# Patient Record
Sex: Female | Born: 1980
Health system: Southern US, Community
[De-identification: ages and names within clinical notes are randomized; demographics above are authoritative.]

## PROBLEM LIST (undated history)

## (undated) HISTORY — PX: COSMETIC SURGERY: SHX468

---

## 2003-06-10 ENCOUNTER — Emergency Department (HOSPITAL_COMMUNITY): Admission: EM | Admit: 2003-06-10 | Discharge: 2003-06-10 | Payer: Self-pay | Admitting: Emergency Medicine

## 2007-02-26 ENCOUNTER — Emergency Department (HOSPITAL_COMMUNITY): Admission: EM | Admit: 2007-02-26 | Discharge: 2007-02-26 | Payer: Self-pay | Admitting: Emergency Medicine

## 2007-11-14 ENCOUNTER — Emergency Department (HOSPITAL_COMMUNITY): Admission: EM | Admit: 2007-11-14 | Discharge: 2007-11-14 | Payer: Self-pay | Admitting: Family Medicine

## 2013-08-26 ENCOUNTER — Encounter (HOSPITAL_COMMUNITY): Payer: Self-pay | Admitting: Emergency Medicine

## 2013-08-26 ENCOUNTER — Emergency Department (HOSPITAL_COMMUNITY)
Admission: EM | Admit: 2013-08-26 | Discharge: 2013-08-26 | Disposition: A | Discharge status: Home / Self Care | Payer: 59 | Source: Home / Self Care | Attending: Emergency Medicine | Admitting: Emergency Medicine

## 2013-08-26 DIAGNOSIS — J019 Acute sinusitis, unspecified: Secondary | ICD-10-CM

## 2013-08-26 DIAGNOSIS — J310 Chronic rhinitis: Secondary | ICD-10-CM

## 2013-08-26 MED ORDER — FEXOFENADINE-PSEUDOEPHED ER 60-120 MG PO TB12: 1.0000 | ORAL_TABLET | Freq: Two times a day (BID) | ORAL | Status: DC

## 2013-08-26 MED ORDER — AMOXICILLIN-POT CLAVULANATE 875-125 MG PO TABS: 1.0000 | ORAL_TABLET | Freq: Two times a day (BID) | ORAL | Status: DC

## 2013-08-26 MED ORDER — FLUTICASONE PROPIONATE 50 MCG/ACT NA SUSP: 2.0000 | Freq: Every day | NASAL | Status: DC

## 2013-08-26 MED ORDER — FLUCONAZOLE 150 MG PO TABS: 150.0000 mg | ORAL_TABLET | Freq: Once | ORAL | Status: DC

## 2013-08-26 NOTE — ED Provider Notes (Signed)
Chief Complaint:   Chief Complaint  Patient presents with  . 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 For the past month he's had nasal congestion with yellow-green drainage, sinus pressure, ear pressure, sneezing, itchy, watery eyes, and a dry cough. She denies fever, chills, headache, sore throat, or GI symptoms. She has no definite history of allergies or hayfever, and is not exposed to any obvious allergens or antigens.  Review of Systems:  Other than noted above, the patient denies any of the following symptoms: Systemic:  No fevers, chills, sweats, weight loss or gain, fatigue, or tiredness. Eye:  No redness or discharge. ENT:  No ear pain, drainage, headache, nasal congestion, drainage, sinus pressure, difficulty swallowing, or sore throat. Neck:  No neck pain or swollen glands. Lungs:  No cough, sputum production, hemoptysis, wheezing, chest tightness, shortness of breath or chest pain. GI:  No abdominal pain, nausea, vomiting or diarrhea.  PMFSH:  Past medical history, family history, social history, meds, and allergies were reviewed. She is allergic to tetracycline. She takes occasional Valtrex for fever blisters on her lips.  Physical Exam:   Vital signs:  BP 110/73  Pulse 86  Temp(Src) 97.2 F (36.2 C) (Oral)  Resp 20  SpO2 98%  LMP 08/03/2013 General:  Alert and oriented.  In no distress.  Skin warm and dry. Eye:  No conjunctival injection or drainage. Lids were normal. ENT:  TMs and canals were normal, without erythema or inflammation.  Nasal mucosa was congested bilaterally with clear drainage.  Mucous membranes were moist.  Pharynx was clear with no exudate or drainage.  There were no oral ulcerations or lesions. Neck:  Supple, no adenopathy, tenderness or mass. Lungs:  No respiratory distress.  Lungs were clear to auscultation, without wheezes, rales or  rhonchi.  Breath sounds were clear and equal bilaterally.  Heart:  Regular rhythm, without gallops, murmers or rubs. Skin:  Clear, warm, and dry, without rash or lesions.  Assessment:  The primary encounter diagnosis was Acute sinusitis. A diagnosis of Rhinitis was also pertinent to this visit.  She appears to have rhinitis, this may because by allergic rhinitis or vasomotor rhinitis. Additionally she is using nasal sprays which may be causing rhinitis medicamentosa. There may be some element of infection as well. To use medications for the next 10-20 days. If no improvement consider consulting an allergist.  Plan:   1.  Meds:  The following meds were prescribed:   Discharge Medication List as of 08/26/2013 12:06 PM    START taking these medications   Details  amoxicillin-clavulanate (AUGMENTIN) 875-125 MG per tablet Take 1 tablet by mouth 2 (two) times daily., Starting 08/26/2013, Until Discontinued, Normal    fexofenadine-pseudoephedrine (ALLEGRA-D) 60-120 MG per tablet Take 1 tablet by mouth every 12 (twelve) hours., Starting 08/26/2013, Until Discontinued, Normal    fluconazole (DIFLUCAN) 150 MG tablet Take 1 tablet (150 mg total) by mouth once., Starting 08/26/2013, Normal    fluticasone (FLONASE) 50 MCG/ACT nasal spray Place 2 sprays into both nostrils daily., Starting 08/26/2013, Until Discontinued, Normal        2.  Patient Education/Counseling:  The patient was given appropriate handouts, self care instructions, and instructed in symptomatic relief.  Given some instructions about allergen avoidance.  3.  Follow up:  The patient was told to follow up if no better in 3 to 4 days,  if becoming worse in any way, and given some red flag symptoms such as fever or worsening pain which would prompt immediate return.  Follow up with Dr. Patrice Paradise if no better in 20 days.      Reuben Likes, MD 08/26/13 1346

## 2013-08-26 NOTE — ED Notes (Signed)
Reports a month long history of nasal congestion, facial pain and pressure, unsure about fever.  Reports decongestant nose spray helped the most, but has limited use of this drug as instructed.

## 2014-11-22 ENCOUNTER — Emergency Department
Admission: EM | Admit: 2014-11-22 | Discharge: 2014-11-22 | Disposition: A | Discharge status: Home / Self Care | Payer: 59 | Source: Home / Self Care | Attending: Family Medicine | Admitting: Family Medicine

## 2014-11-22 ENCOUNTER — Encounter: Payer: Self-pay | Admitting: Emergency Medicine

## 2014-11-22 DIAGNOSIS — J329 Chronic sinusitis, unspecified: Secondary | ICD-10-CM

## 2014-11-22 DIAGNOSIS — B349 Viral infection, unspecified: Secondary | ICD-10-CM

## 2014-11-22 DIAGNOSIS — B9789 Other viral agents as the cause of diseases classified elsewhere: Secondary | ICD-10-CM

## 2014-11-22 DIAGNOSIS — L259 Unspecified contact dermatitis, unspecified cause: Secondary | ICD-10-CM

## 2014-11-22 MED ORDER — TRIAMCINOLONE ACETONIDE 0.1 % EX CREA: 1.0000 "application " | TOPICAL_CREAM | Freq: Two times a day (BID) | CUTANEOUS | Status: DC

## 2014-11-22 MED ORDER — IPRATROPIUM BROMIDE 0.06 % NA SOLN: 2.0000 | Freq: Four times a day (QID) | NASAL | Status: DC

## 2014-11-22 MED ORDER — PREDNISONE 5 MG PO KIT: PACK | ORAL | Status: DC

## 2014-11-22 NOTE — Discharge Instructions (Signed)
Thank you for coming in today. Take the prednisone as directed. Use the Atrovent nasal spray. Use triamcinolone cream as needed. Use over-the-counter Gold Bond Itch as needed. Follow-up with primary care provider.  Contact Dermatitis Contact dermatitis is a reaction to certain substances that touch the skin. Contact dermatitis can be either irritant contact dermatitis or allergic contact dermatitis. Irritant contact dermatitis does not require previous exposure to the substance for a reaction to occur.Allergic contact dermatitis only occurs if you have been exposed to the substance before. Upon a repeat exposure, your body reacts to the substance.  CAUSES  Many substances can cause contact dermatitis. Irritant dermatitis is most commonly caused by repeated exposure to mildly irritating substances, such as:  Makeup.  Soaps.  Detergents.  Bleaches.  Acids.  Metal salts, such as nickel. Allergic contact dermatitis is most commonly caused by exposure to:  Poisonous plants.  Chemicals (deodorants, shampoos).  Jewelry.  Latex.  Neomycin in triple antibiotic cream.  Preservatives in products, including clothing. SYMPTOMS  The area of skin that is exposed may develop:  Dryness or flaking.  Redness.  Cracks.  Itching.  Pain or a burning sensation.  Blisters. With allergic contact dermatitis, there may also be swelling in areas such as the eyelids, mouth, or genitals.  DIAGNOSIS  Your caregiver can usually tell what the problem is by doing a physical exam. In cases where the cause is uncertain and an allergic contact dermatitis is suspected, a patch skin test may be performed to help determine the cause of your dermatitis. TREATMENT Treatment includes protecting the skin from further contact with the irritating substance by avoiding that substance if possible. Barrier creams, powders, and gloves may be helpful. Your caregiver may also recommend:  Steroid creams or  ointments applied 2 times daily. For best results, soak the rash area in cool water for 20 minutes. Then apply the medicine. Cover the area with a plastic wrap. You can store the steroid cream in the refrigerator for a "chilly" effect on your rash. That may decrease itching. Oral steroid medicines may be needed in more severe cases.  Antibiotics or antibacterial ointments if a skin infection is present.  Antihistamine lotion or an antihistamine taken by mouth to ease itching.  Lubricants to keep moisture in your skin.  Burow's solution to reduce redness and soreness or to dry a weeping rash. Mix one packet or tablet of solution in 2 cups cool water. Dip a clean washcloth in the mixture, wring it out a bit, and put it on the affected area. Leave the cloth in place for 30 minutes. Do this as often as possible throughout the day.  Taking several cornstarch or baking soda baths daily if the area is too large to cover with a washcloth. Harsh chemicals, such as alkalis or acids, can cause skin damage that is like a burn. You should flush your skin for 15 to 20 minutes with cold water after such an exposure. You should also seek immediate medical care after exposure. Bandages (dressings), antibiotics, and pain medicine may be needed for severely irritated skin.  HOME CARE INSTRUCTIONS  Avoid the substance that caused your reaction.  Keep the area of skin that is affected away from hot water, soap, sunlight, chemicals, acidic substances, or anything else that would irritate your skin.  Do not scratch the rash. Scratching may cause the rash to become infected.  You may take cool baths to help stop the itching.  Only take over-the-counter or prescription medicines as  directed by your caregiver.  See your caregiver for follow-up care as directed to make sure your skin is healing properly. SEEK MEDICAL CARE IF:   Your condition is not better after 3 days of treatment.  You seem to be getting  worse.  You see signs of infection such as swelling, tenderness, redness, soreness, or warmth in the affected area.  You have any problems related to your medicines. Document Released: 09/16/2000 Document Revised: 12/12/2011 Document Reviewed: 02/22/2011 [facility] Patient Information 2015 [place], [place]. This information is not intended to replace advice given to you by your health care provider. Make sure you discuss any questions you have with your health care provider.   Sinusitis Sinusitis is redness, soreness, and inflammation of the paranasal sinuses. Paranasal sinuses are air pockets within the bones of your face (beneath the eyes, the middle of the forehead, or above the eyes). In healthy paranasal sinuses, mucus is able to drain out, and air is able to circulate through them by way of your nose. However, when your paranasal sinuses are inflamed, mucus and air can become trapped. This can allow bacteria and other germs to grow and cause infection. Sinusitis can develop quickly and last only a short time (acute) or continue over a long period (chronic). Sinusitis that lasts for more than 12 weeks is considered chronic.  CAUSES  Causes of sinusitis include:  Allergies.  Structural abnormalities, such as displacement of the cartilage that separates your nostrils (deviated septum), which can decrease the air flow through your nose and sinuses and affect sinus drainage.  Functional abnormalities, such as when the small hairs (cilia) that line your sinuses and help remove mucus do not work properly or are not present. SIGNS AND SYMPTOMS  Symptoms of acute and chronic sinusitis are the same. The primary symptoms are pain and pressure around the affected sinuses. Other symptoms include:  Upper toothache.  Earache.  Headache.  Bad breath.  Decreased sense of smell and taste.  A cough, which worsens when you are lying flat.  Fatigue.  Fever.  Thick drainage from your nose, which  often is green and may contain pus (purulent).  Swelling and warmth over the affected sinuses. DIAGNOSIS  Your health care provider will perform a physical exam. During the exam, your health care provider may:  Look in your nose for signs of abnormal growths in your nostrils (nasal polyps).  Tap over the affected sinus to check for signs of infection.  View the inside of your sinuses (endoscopy) using an imaging device that has a light attached (endoscope). If your health care provider suspects that you have chronic sinusitis, one or more of the following tests may be recommended:  Allergy tests.  Nasal culture. A sample of mucus is taken from your nose, sent to a lab, and screened for bacteria.  Nasal cytology. A sample of mucus is taken from your nose and examined by your health care provider to determine if your sinusitis is related to an allergy. TREATMENT  Most cases of acute sinusitis are related to a viral infection and will resolve on their own within 10 days. Sometimes medicines are prescribed to help relieve symptoms (pain medicine, decongestants, nasal steroid sprays, or saline sprays).  However, for sinusitis related to a bacterial infection, your health care provider will prescribe antibiotic medicines. These are medicines that will help kill the bacteria causing the infection.  Rarely, sinusitis is caused by a fungal infection. In theses cases, your health care provider will prescribe antifungal  medicine. For some cases of chronic sinusitis, surgery is needed. Generally, these are cases in which sinusitis recurs more than 3 times per year, despite other treatments. HOME CARE INSTRUCTIONS   Drink plenty of water. Water helps thin the mucus so your sinuses can drain more easily.  Use a humidifier.  Inhale steam 3 to 4 times a day (for example, sit in the bathroom with the shower running).  Apply a warm, moist washcloth to your face 3 to 4 times a day, or as directed by your  health care provider.  Use saline nasal sprays to help moisten and clean your sinuses.  Take medicines only as directed by your health care provider.  If you were prescribed either an antibiotic or antifungal medicine, finish it all even if you start to feel better. SEEK IMMEDIATE MEDICAL CARE IF:  You have increasing pain or severe headaches.  You have nausea, vomiting, or drowsiness.  You have swelling around your face.  You have vision problems.  You have a stiff neck.  You have difficulty breathing. MAKE SURE YOU:   Understand these instructions.  Will watch your condition.  Will get help right away if you are not doing well or get worse. Document Released: 09/19/2005 Document Revised: 02/03/2014 Document Reviewed: 10/04/2011 [facility] Patient Information 2015 [place], [place]. This information is not intended to replace advice given to you by your health care provider. Make sure you discuss any questions you have with your health care provider.

## 2014-11-22 NOTE — ED Provider Notes (Signed)
Allison Logan is a 34 y.o. female who presents to Urgent Care today for sinus congestion and pressure and discharge present for 5 days. No fevers chills nausea vomiting or diarrhea. Patient has tried Claritin which helps. Symptoms are bilateral.  Additionally patient notes a rash on her left buttocks. This is been present for 3 days. The rash is itchy. No soap detergent shampoos cosmetics etc. patient has tried hydrocortisone and Benadryl which did not help. No new medications or oral lesions.   History reviewed. No pertinent past medical history. Past Surgical History  Procedure Laterality Date  . Cosmetic surgery     History  Substance Use Topics  . Smoking status: Current Every Day Smoker  . Smokeless tobacco: Not on file  . Alcohol Use: Yes   ROS as above Medications: No current facility-administered medications for this encounter.   Current Outpatient Prescriptions  Medication Sig Dispense Refill  . Ascorbic Acid (VITAMIN C PO) Take by mouth.    . fexofenadine-pseudoephedrine (ALLEGRA-D) 60-120 MG per tablet Take 1 tablet by mouth every 12 (twelve) hours. 30 tablet 2  . fluticasone (FLONASE) 50 MCG/ACT nasal spray Place 2 sprays into both nostrils daily. 16 g 2  . ipratropium (ATROVENT) 0.06 % nasal spray Place 2 sprays into both nostrils 4 (four) times daily. 15 mL 1  . Multiple Vitamin (MULTIVITAMIN) tablet Take 1 tablet by mouth daily.    Marland Kitchen OVER THE COUNTER MEDICATION Decongestant nasal spray    . PredniSONE 5 MG KIT 12 days dosepack 1 kit 0  . triamcinolone cream (KENALOG) 0.1 % Apply 1 application topically 2 (two) times daily. 60 g 1  . VALACYCLOVIR HCL PO Take by mouth.     Allergies  Allergen Reactions  . Tetracyclines & Related      Exam:  BP 128/80 mmHg  Pulse 80  Temp(Src) 97 F (36.1 C) (Oral)  Wt 113 lb (51.256 kg)  SpO2 100%  LMP 11/16/2014 Gen: Well NAD HEENT: EOMI,  MMM clear nasal discharge present. Mildly inflamed nasal turbinates are present  bilaterally. Normal tympanic membranes bilaterally. Posterior pharynx is normal appearing Maxillary sinuses are nontender bilaterally. No oral lesions Lungs: Normal work of breathing. CTABL Heart: RRR no MRG Abd: NABS, Soft. Nondistended, Nontender Exts: Brisk capillary refill, warm and well perfused.  Skin: Mildly erythematous rash left buttocks nontender blanchable. Rash is macular  No results found for this or any previous visit (from the past 24 hour(s)). No results found.  Assessment and Plan: 34 y.o. female with  1) sinusitis. Treatment with prednisone and Atrovent nasal spray. 2) rash: Likely contact dermatitis. Treatment with triamcinolone cream  Discussed warning signs or symptoms. Please see discharge instructions. Patient expresses understanding.     Gregor Hams, MD 11/22/14 [phone]

## 2014-11-22 NOTE — ED Notes (Signed)
Pt c/o rash on her left buttocks she noticed it 3 days ago. States it it itching but denies pain or drainage. Also c/o nasal congestion and green mucous.

## 2014-11-24 ENCOUNTER — Encounter (HOSPITAL_BASED_OUTPATIENT_CLINIC_OR_DEPARTMENT_OTHER): Payer: Self-pay | Admitting: *Deleted

## 2014-11-24 ENCOUNTER — Telehealth: Payer: Self-pay | Admitting: *Deleted

## 2014-11-24 ENCOUNTER — Emergency Department (HOSPITAL_BASED_OUTPATIENT_CLINIC_OR_DEPARTMENT_OTHER)
Admission: EM | Admit: 2014-11-24 | Discharge: 2014-11-24 | Disposition: A | Discharge status: Home / Self Care | Payer: 59 | Attending: Emergency Medicine | Admitting: Emergency Medicine

## 2014-11-24 DIAGNOSIS — Z7952 Long term (current) use of systemic steroids: Secondary | ICD-10-CM | POA: Insufficient documentation

## 2014-11-24 DIAGNOSIS — Z720 Tobacco use: Secondary | ICD-10-CM | POA: Insufficient documentation

## 2014-11-24 DIAGNOSIS — R002 Palpitations: Secondary | ICD-10-CM | POA: Diagnosis not present

## 2014-11-24 DIAGNOSIS — Z7951 Long term (current) use of inhaled steroids: Secondary | ICD-10-CM | POA: Insufficient documentation

## 2014-11-24 DIAGNOSIS — Z79899 Other long term (current) drug therapy: Secondary | ICD-10-CM | POA: Diagnosis not present

## 2014-11-24 DIAGNOSIS — R079 Chest pain, unspecified: Secondary | ICD-10-CM | POA: Diagnosis present

## 2014-11-24 LAB — CBC
HEMATOCRIT: 36.8 % (ref 36.0–46.0)
Hemoglobin: 12.8 g/dL (ref 12.0–15.0)
MCH: 32.9 pg (ref 26.0–34.0)
MCHC: 34.8 g/dL (ref 30.0–36.0)
MCV: 94.6 fL (ref 78.0–100.0)
PLATELETS: 325 10*3/uL (ref 150–400)
RBC: 3.89 MIL/uL (ref 3.87–5.11)
RDW: 11.5 % (ref 11.5–15.5)
WBC: 5.2 10*3/uL (ref 4.0–10.5)

## 2014-11-24 LAB — BASIC METABOLIC PANEL
Anion gap: 3 — ABNORMAL LOW (ref 5–15)
BUN: 13 mg/dL (ref 6–23)
CHLORIDE: 102 mmol/L (ref 96–112)
CO2: 30 mmol/L (ref 19–32)
CREATININE: 0.66 mg/dL (ref 0.50–1.10)
Calcium: 9 mg/dL (ref 8.4–10.5)
GFR calc non Af Amer: >90 mL/min (ref >90)
Glucose, Bld: 119 mg/dL — ABNORMAL HIGH (ref 70–99)
Potassium: 4.3 mmol/L (ref 3.5–5.1)
SODIUM: 135 mmol/L (ref 135–145)

## 2014-11-24 LAB — TROPONIN I

## 2014-11-24 NOTE — ED Notes (Signed)
Pt d/c home- no new rx given

## 2014-11-24 NOTE — ED Provider Notes (Signed)
CSN: [id number]     Arrival date & time 11/24/14  1626 History   This chart was scribed for  , MD by Nadim Abu Hashem, ED Scribe. The patient was seen in MH11/[facility]11 and the patient's care was started at 5:07 PM.  Chief Complaint  Patient presents with  . Chest Pain   Patient is a 33 y.o. female presenting with chest pain. The history is provided by the patient. No language interpreter was used.  Chest Pain Associated symptoms: no abdominal pain, no back pain, no cough, no diaphoresis, no dizziness, no fatigue, no fever, no headache, no nausea, no numbness, no shortness of breath, not vomiting and no weakness     HPI Comments: Allison Logan is a 33 y.o. female who presents to the Emergency Department complaining of first time heart palpations which first began this morning. Checked her HR this morning and it was in the 150s. Then checked it while at work around 3:00 PM and her HR was 120s. She has intermittent chest pressure usually when her heart is beating fast, was lightheaded but no longer. Seen at urgent care for a HA and rash and given prednisone and steroid cream a few days ago.  She has only taken one dose of prednisone and she takes clartin D 12 hour but she takes this everyday. No FHx of SVT or heart murmurs. No long trips or recent surgeries. She denies SOB, pain with breathing, no leg swelling, abdominal pain, vomiting and diarrhea. Pt works at the [employer].  History reviewed. No pertinent past medical history. Past Surgical History  Procedure Laterality Date  . Cosmetic surgery     No family history on file. History  Substance Use Topics  . Smoking status: Current Some Day Smoker  . Smokeless tobacco: Not on file  . Alcohol Use: Yes     Comment: 1-2 glasses wine/day   OB History    No data available     Review of Systems  Constitutional: Negative for fever, chills, diaphoresis and fatigue.  HENT: Negative for congestion, rhinorrhea and sneezing.    Eyes: Negative.   Respiratory: Positive for chest tightness. Negative for cough and shortness of breath.   Cardiovascular: Negative for chest pain and leg swelling.  Gastrointestinal: Negative for nausea, vomiting, abdominal pain, diarrhea and blood in stool.  Genitourinary: Negative for frequency, hematuria, flank pain and difficulty urinating.  Musculoskeletal: Negative for back pain and arthralgias.  Skin: Negative for rash.  Neurological: Negative for dizziness, speech difficulty, weakness, numbness and headaches.      Allergies  Tetracyclines & related  Home Medications   Prior to Admission medications   Medication Sig Start Date End Date Taking? Authorizing Provider  Ascorbic Acid (VITAMIN C PO) Take by mouth.   Yes Historical Provider, MD  fluticasone (FLONASE) 50 MCG/ACT nasal spray Place 2 sprays into both nostrils daily. 08/26/13  Yes David C Keller, MD  Multiple Vitamin (MULTIVITAMIN) tablet Take 1 tablet by mouth daily.   Yes Historical Provider, MD  VALACYCLOVIR HCL PO Take by mouth as needed.    Yes Historical Provider, MD  fexofenadine-pseudoephedrine (ALLEGRA-D) 60-120 MG per tablet Take 1 tablet by mouth every 12 (twelve) hours. 08/26/13   David C Keller, MD  ipratropium (ATROVENT) 0.06 % nasal spray Place 2 sprays into both nostrils 4 (four) times daily. 11/22/14   Evan S Corey, MD  OVER THE COUNTER MEDICATION Decongestant nasal spray    Historical Provider, MD  PredniSONE 5 MG KIT   12 days dosepack 11/22/14   Evan S Corey, MD  triamcinolone cream (KENALOG) 0.1 % Apply 1 application topically 2 (two) times daily. 11/22/14   Evan S Corey, MD   BP 120/76 mmHg  Pulse 81  Temp(Src) 97.9 F (36.6 C) (Oral)  Resp 25  Ht 4' 11" (1.499 m)  Wt 110 lb (49.896 kg)  BMI 22.21 kg/m2  SpO2 100%  LMP 11/16/2014 Physical Exam  Constitutional: She is oriented to person, place, and time. She appears well-developed and well-nourished.  HENT:  Head: Normocephalic and atraumatic.   Eyes: Pupils are equal, round, and reactive to light.  Neck: Normal range of motion. Neck supple.  Cardiovascular: Normal rate, regular rhythm and normal heart sounds.   Pulmonary/Chest: Effort normal and breath sounds normal. No respiratory distress. She has no wheezes. She has no rales. She exhibits no tenderness.  Abdominal: Soft. Bowel sounds are normal. There is no tenderness. There is no rebound and no guarding.  Musculoskeletal: Normal range of motion. She exhibits no edema.  No calf tenderness  Lymphadenopathy:    She has no cervical adenopathy.  Neurological: She is alert and oriented to person, place, and time.  Skin: Skin is warm and dry. No rash noted.  Psychiatric: She has a normal mood and affect.    ED Course  Procedures  DIAGNOSTIC STUDIES: Oxygen Saturation is 100% on room air, normal by my interpretation.    COORDINATION OF CARE: 5:16 PM Discussed treatment plan with pt at bedside and pt agreed to plan.  Labs Review Labs Reviewed  BASIC METABOLIC PANEL - Abnormal; Notable for the following:    Glucose, Bld 119 (*)    Anion gap 3 (*)    All other components within normal limits  CBC  TROPONIN I    Imaging Review No results found.   EKG Interpretation   Date/Time:  Monday November 24 2014 16:33:31 EST Ventricular Rate:  89 PR Interval:  118 QRS Duration: 94 QT Interval:  372 QTC Calculation: 452 R Axis:   65 Text Interpretation:  Normal sinus rhythm Incomplete right bundle branch  block Borderline ECG No old tracing to compare Confirmed by   MD,   ([id number]) on 11/24/2014 4:53:23 PM      MDM   Final diagnoses:  Palpitations   Pt has had no symptoms in the ED.  Has remained in sinus rhythm.  Labs okay.  Will refer to cardiology for outpt f/u and possible Holter monitoring.  Return precautions given.  I personally performed the services described in this documentation, which was scribed in my presence.  The recorded information has  been reviewed and considered.       , MD 11/24/14 1859 

## 2014-11-24 NOTE — ED Notes (Signed)
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

## 2014-11-24 NOTE — Discharge Instructions (Signed)

## 2015-10-14 MED FILL — VALACYCLOVIR HCL 500 MG TAB: 500 | 5 days supply | Qty: 10 | Fill #2

## 2015-10-28 DIAGNOSIS — D225 Melanocytic nevi of trunk: Secondary | ICD-10-CM | POA: Diagnosis not present

## 2015-10-28 DIAGNOSIS — D2262 Melanocytic nevi of left upper limb, including shoulder: Secondary | ICD-10-CM | POA: Diagnosis not present

## 2015-10-28 DIAGNOSIS — D2261 Melanocytic nevi of right upper limb, including shoulder: Secondary | ICD-10-CM | POA: Diagnosis not present

## 2016-02-08 MED FILL — VALACYCLOVIR HCL 500 MG TAB: 500 | 5 days supply | Qty: 10 | Fill #3

## 2016-02-17 DIAGNOSIS — J039 Acute tonsillitis, unspecified: Secondary | ICD-10-CM | POA: Diagnosis not present

## 2016-02-17 DIAGNOSIS — J029 Acute pharyngitis, unspecified: Secondary | ICD-10-CM | POA: Diagnosis not present

## 2016-02-17 MED FILL — AMOXICILLIN 875 MG TABLET: 875 | 10 days supply | Qty: 20 | Fill #0

## 2016-02-17 MED FILL — FLUCONAZOLE 150 MG TABLET: 150 | 1 days supply | Qty: 1 | Fill #0

## 2016-02-28 ENCOUNTER — Encounter: Payer: Self-pay | Admitting: Emergency Medicine

## 2016-02-28 ENCOUNTER — Emergency Department
Admission: EM | Admit: 2016-02-28 | Discharge: 2016-02-28 | Disposition: A | Discharge status: Home / Self Care | Payer: 59 | Source: Home / Self Care | Attending: Family Medicine | Admitting: Family Medicine

## 2016-02-28 DIAGNOSIS — L03113 Cellulitis of right upper limb: Secondary | ICD-10-CM

## 2016-02-28 DIAGNOSIS — W540XXA Bitten by dog, initial encounter: Secondary | ICD-10-CM | POA: Diagnosis not present

## 2016-02-28 DIAGNOSIS — S41151A Open bite of right upper arm, initial encounter: Secondary | ICD-10-CM | POA: Diagnosis not present

## 2016-02-28 MED ORDER — MUPIROCIN 2 % EX OINT
1.0000 | TOPICAL_OINTMENT | Freq: Three times a day (TID) | CUTANEOUS | Status: DC
Start: 2016-02-28 — End: 2017-10-23

## 2016-02-28 MED ORDER — HYDROCODONE-ACETAMINOPHEN 5-325 MG PO TABS: 1.0000 | ORAL_TABLET | Freq: Four times a day (QID) | ORAL | Status: DC | PRN

## 2016-02-28 MED ORDER — AMOXICILLIN-POT CLAVULANATE 875-125 MG PO TABS: 1.0000 | ORAL_TABLET | Freq: Two times a day (BID) | ORAL | Status: DC

## 2016-02-28 MED ORDER — KETOROLAC TROMETHAMINE 30 MG/ML IJ SOLN
30.0000 mg | Freq: Once | INTRAMUSCULAR | Status: AC
  Administered 2016-02-28: 30 mg via INTRAMUSCULAR

## 2016-02-28 NOTE — Discharge Instructions (Signed)
Elevate arm.  Change bandage three times daily.  May take Ibuprofen 200mg , 4 tabs every 8 hours with food.  If symptoms become significantly worse during the night or over the weekend, proceed to the local emergency room.    Animal Bite Animal bites can range from mild to serious. An animal bite can result in a scratch on the skin, a deep open cut, a puncture of the skin, a crush injury, or tearing away of the skin or a body part. A small bite from a house pet will usually not cause serious problems. However, some animal bites can become infected or injure a bone or other tissue.  Bites from certain animals can be more dangerous because of the risk of spreading rabies, which is a serious viral infection. This risk is higher with bites from stray animals or wild animals, such as raccoons, foxes, skunks, and bats. Dogs are responsible for most animal bites. Children are bitten more often than adults. SYMPTOMS  Common symptoms of an animal bite include:   Pain.   Bleeding.   Swelling.   Bruising.  DIAGNOSIS  This condition may be diagnosed based on a physical exam and medical history. Your health care provider will examine the wound and ask for details about the animal and how the bite happened. You may also have tests, such as:   Blood tests to check for infection or to determine if surgery is needed.  X-rays to check for damage to bones or joints.  Culture test. This uses a sample of fluid from the wound to check for infection. TREATMENT  Treatment varies depending on the location and type of animal bite and your medical history. Treatment may include:   Wound care. This often includes cleaning the wound, flushing the wound with saline solution, and applying a bandage (dressing). Sometimes, the wound is left open to heal because of the high risk of infection. However, in some cases, the wound may be closed with stitches (sutures), staples, skin glue, or adhesive strips.   Antibiotic  medicine.   Tetanus shot.   Rabies treatment if the animal could have rabies.  In some cases, bites that have become infected may require IV antibiotics and surgical treatment in the hospital.  [place]  Follow instructions from your health care provider about how to take care of your wound. Make sure you:  Wash your hands with soap and water before you change your dressing. If soap and water are not available, use hand sanitizer.  Change your dressing as told by your health care provider.  Leave sutures, skin glue, or adhesive strips in place. These skin closures may need to be in place for 2 weeks or longer. If adhesive strip edges start to loosen and curl up, you may trim the loose edges. Do not remove adhesive strips completely unless your health care provider tells you to do that.  Check your wound every day for signs of infection. Watch for:   Increasing redness, swelling, or pain.   Fluid, blood, or pus.  General Instructions  Take or apply over-the-counter and prescription medicines only as told by your health care provider.   If you were prescribed an antibiotic, take or apply it as told by your health care provider. Do not stop using the antibiotic even if your condition improves.   Keep the injured area raised (elevated) above the level of your heart while you are sitting or lying down, if this is possible.  If directed, apply ice to the injured area.   Put ice in a plastic bag.   Place a towel between your skin and the bag.   Leave the ice on for 20 minutes, 2-3 times per day.   Keep all follow-up visits as told by your health care provider. This is important.  SEEK MEDICAL CARE IF:  You have increasing redness, swelling, or pain at the site of your wound.   You have a general feeling of sickness (malaise).   You feel nauseous or you vomit.   You have pain that does not get better.  SEEK IMMEDIATE MEDICAL  CARE IF:  You have a red streak extending away from your wound.   You have fluid, blood, or pus coming from your wound.   You have a fever or chills.   You have trouble moving your injured area.   You have numbness or tingling extending beyond the wound.   This information is not intended to replace advice given to you by your health care provider. Make sure you discuss any questions you have with your health care provider.   Document Released: 06/07/2011 Document Revised: 06/10/2015 Document Reviewed: 02/04/2015 Elsevier Interactive Patient Education 2016 Else[employer]    Cellulitis Cellulitis is an infection of the skin and the tissue beneath it. The infected area is usually red and tender. Cellulitis occurs most often in the arms and lower legs.  CAUSES  Cellulitis is caused by bacteria that enter the skin through cracks or cuts in the skin. The most common types of bacteria that cause cellulitis are staphylococci and streptococci. SIGNS AND SYMPTOMS   Redness and warmth.  Swelling.  Tenderness or pain.  Fever. DIAGNOSIS  Your health care provider can usually determine what is wrong based on a physical exam. Blood tests may also be done. TREATMENT  Treatment usually involves taking an antibiotic medicine. HOME CARE INSTRUCTIONS   Take your antibiotic medicine as directed by your health care provider. Finish the antibiotic even if you start to feel better.  Keep the infected arm or leg elevated to reduce swelling.  Apply a warm cloth to the affected area up to 4 times per day to relieve pain.  Take medicines only as directed by your health care provider.  Keep all follow-up visits as directed by your health care provider. SEEK MEDICAL CARE IF:   You notice red streaks coming from the infected area.  Your red area gets larger or turns dark in color.  Your bone or joint underneath the infected area becomes painful after the skin has healed.  Your infection  returns in the same area or another area.  You notice a swollen bump in the infected area.  You develop new symptoms.  You have a fever. SEEK IMMEDIATE MEDICAL CARE IF:   You feel very sleepy.  You develop vomiting or diarrhea.  You have a general ill feeling (malaise) with muscle aches and pains.   This information is not intended to replace advice given to you by your health care provider. Make sure you discuss any questions you have with your health care provider.   Document Released: 06/29/2005 Document Revised: 06/10/2015 Document Reviewed: 12/05/2011 Elsevier Interactive Patient Education [employer].

## 2016-02-28 NOTE — ED Notes (Signed)
Was bitten on right forearm yesterday by neighbor's dog; documented current rabies up to date on animal; police report was written. She is current on tetanus immunization. Experiencing pain, redness and edema at site of punctures.
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
## 2016-02-29 ENCOUNTER — Telehealth: Payer: Self-pay | Admitting: Emergency Medicine

## 2016-04-06 MED FILL — VALACYCLOVIR HCL 500 MG TAB: 500 | 5 days supply | Qty: 10 | Fill #0

## 2016-07-15 MED FILL — VALACYCLOVIR HCL 500 MG TAB: 500 | 5 days supply | Qty: 10 | Fill #0 | Status: TO

## 2016-09-07 DIAGNOSIS — Z87898 Personal history of other specified conditions: Secondary | ICD-10-CM | POA: Diagnosis not present

## 2016-09-07 DIAGNOSIS — Z01419 Encounter for gynecological examination (general) (routine) without abnormal findings: Secondary | ICD-10-CM | POA: Diagnosis not present

## 2016-09-07 DIAGNOSIS — Z1151 Encounter for screening for human papillomavirus (HPV): Secondary | ICD-10-CM | POA: Diagnosis not present

## 2016-10-12 DIAGNOSIS — M545 Low back pain: Secondary | ICD-10-CM | POA: Diagnosis not present

## 2016-10-12 MED FILL — METHOCARBAMOL 500 MG TABLET: 500 | 10 days supply | Qty: 30 | Fill #0

## 2016-10-12 MED FILL — METHYLPREDNISOLONE 4 MG TAB: 4 | 6 days supply | Qty: 21 | Fill #0

## 2016-10-26 MED FILL — VALACYCLOVIR HCL 500 MG TAB: 500 | 5 days supply | Qty: 10 | Fill #0

## 2016-12-06 ENCOUNTER — Telehealth: Payer: Self-pay | Admitting: General Practice

## 2016-12-06 NOTE — Telephone Encounter (Signed)
Patient is looking for a new PCP and wanted to know if you would accept her as a new patient.

## 2016-12-06 NOTE — Telephone Encounter (Signed)
Unfortunately I am not accepting new patients at this time.

## 2017-01-30 MED FILL — VALACYCLOVIR HCL 500 MG TAB: 500 | 5 days supply | Qty: 10 | Fill #0

## 2017-03-27 MED FILL — valACYclovir HCL 1 GM TABS: 1 | 3 days supply | Qty: 12 | Fill #0

## 2017-04-07 DIAGNOSIS — R5383 Other fatigue: Secondary | ICD-10-CM | POA: Diagnosis not present

## 2017-04-07 DIAGNOSIS — Z30431 Encounter for routine checking of intrauterine contraceptive device: Secondary | ICD-10-CM | POA: Diagnosis not present

## 2017-04-12 ENCOUNTER — Ambulatory Visit (INDEPENDENT_AMBULATORY_CARE_PROVIDER_SITE_OTHER): Payer: 59 | Admitting: Family Medicine

## 2017-04-12 ENCOUNTER — Other Ambulatory Visit: Payer: Self-pay | Admitting: Family Medicine

## 2017-04-12 DIAGNOSIS — E875 Hyperkalemia: Secondary | ICD-10-CM

## 2017-04-12 LAB — BASIC METABOLIC PANEL
BUN: 11 mg/dL (ref 6–23)
CALCIUM: 9.7 mg/dL (ref 8.4–10.5)
CO2: 28 meq/L (ref 19–32)
CREATININE: 0.8 mg/dL (ref 0.40–1.20)
Chloride: 102 mEq/L (ref 96–112)
GFR: 86.12 mL/min (ref >60.00)
Glucose, Bld: 117 mg/dL — ABNORMAL HIGH (ref 70–99)
Potassium: 3.7 mEq/L (ref 3.5–5.1)
SODIUM: 138 meq/L (ref 135–145)

## 2017-04-12 NOTE — Progress Notes (Signed)
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  Assumed to be hemolyzed but she would like to recheck which is a good idea.  Offered an EKG/ exam but she declines for now To ascultation seems to be having a few PVCs  Received CMP as below:  Results for orders placed or performed in visit on 81/82/99  Basic metabolic panel  Result Value Ref Range   Sodium 138 135 - 145 mEq/L   Potassium 3.7 3.5 - 5.1 mEq/L   Chloride 102 96 - 112 mEq/L   CO2 28 19 - 32 mEq/L   Glucose, Bld 117 (H) 70 - 99 mg/dL   BUN 11 6 - 23 mg/dL   Creatinine, Ser 0.80 0.40 - 1.20 mg/dL   Calcium 9.7 8.4 - 10.5 mg/dL   GFR 86.12 >60.00 mL/min   Pt reassured, her palpitations have resolved. She again declines EKG, will let me know if anything else is needed

## 2017-04-21 MED FILL — VALACYCLOVIR HCL 500 MG TAB: 500 | 5 days supply | Qty: 10 | Fill #1

## 2017-07-12 MED FILL — VALACYCLOVIR HCL 500 MG TAB: 500 | 5 days supply | Qty: 10 | Fill #2

## 2017-09-14 DIAGNOSIS — Z0000 Encounter for general adult medical examination without abnormal findings: Secondary | ICD-10-CM | POA: Diagnosis not present

## 2017-10-02 DIAGNOSIS — Z30432 Encounter for removal of intrauterine contraceptive device: Secondary | ICD-10-CM | POA: Diagnosis not present

## 2017-10-02 MED FILL — NORG-ETHIN ESTRA 0.25-0.035: 0.25-35 | 84 days supply | Qty: 84 | Fill #0

## 2017-10-12 MED FILL — FLUCONAZOLE 200 MG TABLET: 200 | 1 days supply | Qty: 1 | Fill #0

## 2017-10-20 MED FILL — VALACYCLOVIR HCL 500 MG TAB: 500 | 5 days supply | Qty: 10 | Fill #3

## 2017-10-23 ENCOUNTER — Encounter: Payer: Self-pay | Admitting: Medical

## 2017-10-23 ENCOUNTER — Ambulatory Visit (INDEPENDENT_AMBULATORY_CARE_PROVIDER_SITE_OTHER): Payer: 59 | Admitting: Medical

## 2017-10-23 VITALS — Temp 98.3°F | Resp 16

## 2017-10-23 DIAGNOSIS — R059 Cough, unspecified: Secondary | ICD-10-CM

## 2017-10-23 DIAGNOSIS — J029 Acute pharyngitis, unspecified: Secondary | ICD-10-CM | POA: Diagnosis not present

## 2017-10-23 DIAGNOSIS — R05 Cough: Secondary | ICD-10-CM | POA: Diagnosis not present

## 2017-10-23 DIAGNOSIS — J0100 Acute maxillary sinusitis, unspecified: Secondary | ICD-10-CM | POA: Diagnosis not present

## 2017-10-23 DIAGNOSIS — M791 Myalgia, unspecified site: Secondary | ICD-10-CM

## 2017-10-23 LAB — POCT INFLUENZA A/B
Influenza A, POC: NEGATIVE
Influenza B, POC: NEGATIVE

## 2017-10-23 LAB — POCT RAPID STREP A (OFFICE): Rapid Strep A Screen: NEGATIVE

## 2017-10-23 MED ORDER — OSELTAMIVIR PHOSPHATE 75 MG PO CAPS: 75.0000 mg | ORAL_CAPSULE | Freq: Two times a day (BID) | ORAL | 0 refills | Status: DC

## 2017-10-23 MED ORDER — AZITHROMYCIN 250 MG PO TABS: ORAL_TABLET | ORAL | 0 refills | Status: DC

## 2017-10-23 MED FILL — OSELTAMIVIR PHOSPHATE 75 MG: 75 | 5 days supply | Qty: 10 | Fill #0

## 2017-10-23 MED FILL — AZITHROMYCIN 250 MG TABLET: 250 | 5 days supply | Qty: 6 | Fill #0

## 2017-10-23 NOTE — Patient Instructions (Addendum)
For recent st, myalgias, cough, fever, sinus pressure and chills we did rapid strep and rapid flu test both tests are negative.  For nasal congestion can use nasocort.   For cough declined benzonatate or hycodan. But if cough becomes worse can give low dose benzonatate to avoid sedation side effects.  Both rapid test negative. However, test can be falsley negative. Early in illness/2 days but if st worsens, more sinus pressure or bronchitis symptoms persist recommend going ahead and starting azithromycin.(print rx)  Follow up in 7 days or as needed  Above was my initial thoughts and plans.  Later patient informed Allison Logan she also wanted Tamiflu prescription.  She did have some body aches recently.  Since she would be at the upper edge of treatment timeframe recommended she go ahead and start Tamiflu.  It is possible that her rapid flu test was falsely negative.

## 2017-10-23 NOTE — Progress Notes (Signed)
Subjective:    Patient ID: Allison Logan, female    DOB: 27-Mar-1981, 37 y.o.   MRN: [id number]  HPI  Pt has 2 days of st. Mild chills  and some fever. Some bodyaches.  Some nasal congestion. St on swallowing. Nasal drainage colored and coughing up mucus.  Lmp- last week.  Review of Systems  Constitutional: Positive for chills and fever. Negative for diaphoresis.       Low level.  HENT: Positive for congestion, sinus pressure, sinus pain and sore throat. Negative for postnasal drip.   Respiratory: Positive for cough. Negative for chest tightness, shortness of breath and wheezing.   Cardiovascular: Negative for chest pain and palpitations.  Gastrointestinal: Negative for abdominal pain.  Musculoskeletal: Positive for myalgias.       Faint myalgias  Skin: Negative for rash.  Neurological: Negative for dizziness and headaches.  Hematological: Negative for adenopathy. Does not bruise/bleed easily.  Psychiatric/Behavioral: Negative for behavioral problems and confusion.    No past medical history on file.   Social History   Socioeconomic History  . Marital status: Single    Spouse name: Not on file  . Number of children: Not on file  . Years of education: Not on file  . Highest education level: Not on file  Social Needs  . Financial resource strain: Not on file  . Food insecurity - worry: Not on file  . Food insecurity - inability: Not on file  . Transportation needs - medical: Not on file  . Transportation needs - non-medical: Not on file  Occupational History  . Not on file  Tobacco Use  . Smoking status: Former Games developer  . Smokeless tobacco: Never Used  Substance and Sexual Activity  . Alcohol use: Yes    Comment: 1-2 glasses wine/day  . Drug use: No  . Sexual activity: Yes    Birth control/protection: IUD  Other Topics Concern  . Not on file  Social History Narrative  . Not on file    Past Surgical History:  Procedure Laterality Date  . COSMETIC SURGERY       No family history on file.  Allergies  Allergen Reactions  . Latex Swelling    Swelling and Rash Swelling and Rash   . Tetracyclines & Related Swelling and Rash    Current Outpatient Medications on File Prior to Visit  Medication Sig Dispense Refill  . norgestimate-ethinyl estradiol (ORTHO-CYCLEN,SPRINTEC,PREVIFEM) 0.25-35 MG-MCG tablet Take 1 tablet by mouth daily.     No current facility-administered medications on file prior to visit.     Temp 98.3 F (36.8 C) (Oral)   Resp 16   LMP 10/16/2017   SpO2 100%       Objective:   Physical Exam  General  Mental Status - Alert. General Appearance - Well groomed. Not in acute distress.  Skin Rashes- No Rashes.  HEENT Head- Normal. Ear Auditory Canal - Left- Normal. Right - Normal.Tympanic Membrane- Left- Normal. Right- Normal. Eye Sclera/Conjunctiva- Left- Normal. Right- Normal. Nose & Sinuses Nasal Mucosa- Left-  Boggy and Congested. Right-  Boggy and  Congested.Bilateral  No maxillary and  Rt side frontal sinus pressure. Mouth & Throat Lips: Upper Lip- Normal: no dryness, cracking, pallor, cyanosis, or vesicular eruption. Lower Lip-Normal: no dryness, cracking, pallor, cyanosis or vesicular eruption. Buccal Mucosa- Bilateral- No Aphthous ulcers. Oropharynx- No Discharge or Erythema. Tonsils: Characteristics- Bilateral-  Erythema. Size/Enlargement- Bilateral- No enlargement. Discharge- bilateral-None.  Neck Neck- Supple. No Masses.   Chest and Lung Exam  Auscultation: Breath Sounds:-Clear even and unlabored.  Cardiovascular Auscultation:Rythm- Regular, rate and rhythm. Murmurs & Other Heart Sounds:Ausculatation of the heart reveal- No Murmurs.  Lymphatic Head & Neck General Head & Neck Lymphatics: Bilateral: Description- No Localized lymphadenopathy.       Assessment & Plan:  For recent st, myalgias, cough, fever, sinus pressure and chills we did rapid strep and rapid flu test both tests are  negative.  For nasal congestion can use nasocort.   For cough declined benzonatate or hycodan. But if cough becomes worse can give low dose benzonatate to avoid sedation side effects.  Both rapid test negative. However, test can be falsley negative. Early in illness/2 days but if st worsens, more sinus pressure or bronchitis symptoms persist recommend going ahead and starting azithromycin.(print rx)  Follow up in 7 days or as needed  Above was my initial thoughts and plans.  Later patient informed Leavy Cella she also wanted Tamiflu prescription.  She did have some body aches recently.  Since she would be at the upper edge of treatment timeframe recommended she go ahead and start Tamiflu.  It is possible that her rapid flu test was falsely negative.  Leatrice Parilla, Ramon Dredge, PA-C

## 2017-11-07 MED FILL — VALACYCLOVIR HCL 500 MG TAB: 500 | 5 days supply | Qty: 10 | Fill #4

## 2017-12-19 MED FILL — MONO-LINYAH 28 TABLET: 0.25-35 | 84 days supply | Qty: 84 | Fill #1

## 2018-01-03 MED FILL — LARIN FE 1-20 TABLET: 1-20 | 84 days supply | Qty: 84 | Fill #0

## 2018-01-24 ENCOUNTER — Institutional Professional Consult (permissible substitution): Payer: 59 | Admitting: Obstetrics & Gynecology

## 2018-01-30 DIAGNOSIS — Z3169 Encounter for other general counseling and advice on procreation: Secondary | ICD-10-CM | POA: Diagnosis not present

## 2018-01-31 MED FILL — ALPRAZolam 0.25 MG TABS: 0.25 | 1 days supply | Qty: 1 | Fill #0

## 2018-02-07 MED FILL — miSOPROStol 200 MCG TABS: 200 | 1 days supply | Qty: 1 | Fill #0

## 2018-02-28 DIAGNOSIS — Z3202 Encounter for pregnancy test, result negative: Secondary | ICD-10-CM | POA: Diagnosis not present

## 2018-02-28 DIAGNOSIS — Z30430 Encounter for insertion of intrauterine contraceptive device: Secondary | ICD-10-CM | POA: Diagnosis not present

## 2018-03-13 DIAGNOSIS — N760 Acute vaginitis: Secondary | ICD-10-CM | POA: Diagnosis not present

## 2018-03-13 DIAGNOSIS — N390 Urinary tract infection, site not specified: Secondary | ICD-10-CM | POA: Diagnosis not present

## 2018-03-16 ENCOUNTER — Telehealth: Payer: Self-pay

## 2018-03-16 MED ORDER — CEPHALEXIN 500 MG PO CAPS: 500.0000 mg | ORAL_CAPSULE | Freq: Four times a day (QID) | ORAL | 0 refills | Status: DC

## 2018-03-16 MED ORDER — FLUCONAZOLE 150 MG PO TABS: 150.0000 mg | ORAL_TABLET | Freq: Every day | ORAL | 0 refills | Status: DC

## 2018-03-16 MED FILL — FLUCONAZOLE 150 MG TABS: 150 | 2 days supply | Qty: 2 | Fill #0

## 2018-03-16 MED FILL — CEPHALEXIN 500 MG CAPSULE: 500 | 5 days supply | Qty: 20 | Fill #0

## 2018-03-16 NOTE — Addendum Note (Signed)
Addended byDamita Dunnings D on: 03/16/2018 03:22 PM   Modules accepted: Orders

## 2018-03-16 NOTE — Telephone Encounter (Signed)
Has a second left finger infection, swelling and tenderness at the base of the nail, she was able to get some discharge from there this morning. Exam is confirmatory, pulp is soft. Recommend Keflex, call if no better.

## 2018-03-16 NOTE — Telephone Encounter (Signed)
Pt has infected 2nd index finger.

## 2018-03-27 MED FILL — VALACYCLOVIR HCL 500 MG TAB: 500 | 5 days supply | Qty: 10 | Fill #0

## 2018-04-11 DIAGNOSIS — Z113 Encounter for screening for infections with a predominantly sexual mode of transmission: Secondary | ICD-10-CM | POA: Diagnosis not present

## 2018-04-11 DIAGNOSIS — N760 Acute vaginitis: Secondary | ICD-10-CM | POA: Diagnosis not present

## 2018-04-11 DIAGNOSIS — Z30431 Encounter for routine checking of intrauterine contraceptive device: Secondary | ICD-10-CM | POA: Diagnosis not present

## 2018-04-18 ENCOUNTER — Ambulatory Visit: Payer: 59 | Admitting: Family Medicine

## 2018-04-18 ENCOUNTER — Encounter: Payer: Self-pay | Admitting: Family Medicine

## 2018-04-18 ENCOUNTER — Ambulatory Visit (INDEPENDENT_AMBULATORY_CARE_PROVIDER_SITE_OTHER): Payer: 59

## 2018-04-18 VITALS — BP 108/66 | HR 63 | Ht 59.0 in | Wt 112.0 lb

## 2018-04-18 DIAGNOSIS — M546 Pain in thoracic spine: Secondary | ICD-10-CM
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
## 2018-06-15 ENCOUNTER — Telehealth: Payer: Self-pay | Admitting: Medical

## 2018-06-15 MED ORDER — AMOXICILLIN-POT CLAVULANATE 875-125 MG PO TABS: 1.0000 | ORAL_TABLET | Freq: Two times a day (BID) | ORAL | 0 refills | Status: DC

## 2018-06-15 MED FILL — AMOX-CLAV 875-125 MG TABLET: 875-125 | 10 days supply | Qty: 20 | Fill #0

## 2018-06-15 NOTE — Telephone Encounter (Signed)
Pt has 3 days of nasal congestion. Last 24 hours getting rt ear pain. Enough to keep her up at night. Hx of allergies. She is using nasonex and astlelin. Ear looks ok now. Tm clear/faint pink. No redness. No tragal tender. No pain mastoid area.  Sent in augmentin. Continue nasal sprays. If symptom worsen over weekend start augmentin. Possible early OM.  Mackie Pai, PA-C

## 2018-07-23 MED FILL — VALACYCLOVIR HCL 500 MG TAB: 500 | 5 days supply | Qty: 10 | Fill #1

## 2018-10-01 MED FILL — VALACYCLOVIR HCL 500 MG TAB: 500 | 5 days supply | Qty: 10 | Fill #2

## 2018-10-15 DIAGNOSIS — M545 Low back pain: Secondary | ICD-10-CM | POA: Diagnosis not present

## 2018-10-15 DIAGNOSIS — M62830 Muscle spasm of back: Secondary | ICD-10-CM | POA: Diagnosis not present

## 2018-10-15 DIAGNOSIS — M546 Pain in thoracic spine: Secondary | ICD-10-CM | POA: Diagnosis not present

## 2018-10-15 DIAGNOSIS — M542 Cervicalgia: Secondary | ICD-10-CM | POA: Diagnosis not present

## 2018-10-15 DIAGNOSIS — M9902 Segmental and somatic dysfunction of thoracic region: Secondary | ICD-10-CM | POA: Diagnosis not present

## 2018-10-15 DIAGNOSIS — M9903 Segmental and somatic dysfunction of lumbar region: Secondary | ICD-10-CM | POA: Diagnosis not present

## 2019-07-03 DIAGNOSIS — Z6822 Body mass index (BMI) 22.0-22.9, adult: Secondary | ICD-10-CM | POA: Diagnosis not present

## 2019-07-03 DIAGNOSIS — Z01419 Encounter for gynecological examination (general) (routine) without abnormal findings: Secondary | ICD-10-CM | POA: Diagnosis not present

## 2019-07-23 MED FILL — valACYclovir HCL 1 GM TABS: 1 | 4 days supply | Qty: 8 | Fill #0

## 2019-07-24 ENCOUNTER — Ambulatory Visit: Payer: 59 | Admitting: Family Medicine

## 2019-07-24 ENCOUNTER — Other Ambulatory Visit: Payer: Self-pay

## 2019-07-25 ENCOUNTER — Ambulatory Visit (INDEPENDENT_AMBULATORY_CARE_PROVIDER_SITE_OTHER): Payer: 59 | Admitting: Family Medicine

## 2019-07-25 DIAGNOSIS — Z887 Allergy status to serum and vaccine status: Secondary | ICD-10-CM | POA: Diagnosis not present

## 2019-07-25 NOTE — Progress Notes (Signed)
   TELEPHONE ENCOUNTER   Patient verbally agreed to telephone visit and is aware that copayment and coinsurance may apply. Patient was treated using telemedicine according to accepted telemedicine protocols.  Location of the patient: her office Location of provider: Provider's home Names of all persons participating in the telemedicine service and role in the encounter: Arnette Norris, MD [place]:   Chief Complaint  Patient presents with  . Establish Care    Pt agrees to virtual visit.  She is needing to establish care.  She has a H/O a severe allergic reaction to the flu shot and is needing to discuss this.     HPI   Past two years, she has had progressively worrisome reactions to receiving her influenza vaccine.  Two year ago, she felt itchy all over and at the injection site for several days.  Last year, she not only felt itchy all over, but her throat was itchy and she became short of breath.  Antihistamine pre and post dosing was not effective.  Patient Active Problem List   Diagnosis Date Noted  . 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 Acute bilateral thoracic back pain 04/18/2018   Social History   Tobacco Use  . Smoking status: Former Research scientist (life sciences)  . Smokeless tobacco: Never Used  Substance Use Topics  . Alcohol use: Yes    Comment: 1-2 glasses wine/day    Current Outpatient Medications:  .  amoxicillin-clavulanate (AUGMENTIN) 875-125 MG tablet, Take 1 tablet by mouth 2 (two) times daily., Disp: 20 tablet, Rfl: 0 .  PARAGARD INTRAUTERINE COPPER IUD IUD, 1 each by Intrauterine route once., Disp: , Rfl:  Allergies  Allergen Reactions  . Latex Swelling    Swelling and Rash Swelling and Rash   . Influenza Vaccines   . Tetracyclines & Related Swelling and Rash    Assessment & Plan:   1. Allergy to influenza vaccine     No orders of the defined types were placed in this encounter.  No orders of the defined types were placed in this encounter.   Arnette Norris, MD 07/25/2019  Time spent with the patient: 15 minutes, spent in obtaining information about her symptoms, reviewing her previous labs, evaluations, and treatments, counseling her about her condition (please see the discussed topics above), and developing a plan to further investigate it; she had a number of questions which I addressed.   [id number] physician/qualified health professional telephone evaluation 5 to 10 minutes [id number] physician/qualified help functional [place] evaluation for 11 to 20 minutes [id number] physician/qualify he will professional telephone evaluation for 21 to 30 minutes

## 2019-07-25 NOTE — Assessment & Plan Note (Signed)
It is my medical opinion that she should absolutely be except from her flu vaccine.  I sent the following letter to [facility]:  Allison Logan was seen in my clinic on 07/25/2019. She did receive your form to be completed to be except from influenza vaccine.   However, it is my medical opinion that she absolutely needs to be except from this despite the guidelines stated in your letter.  Over the past two years, she has had progressively worrisome reactions to receiving her influenza vaccine.  Two years ago, she felt itchy" all over"and at the injection site for several days.  Last year, she not only felt itchy on her body, but her throat was itchy and she became short of breath.  Antihistamine pre and post dosing was not effective.  As with most allergies, it is worrisome that her second reaction was so close to anaphylaxis that her next reaction could be escalated into anaphylaxis.  Please excuse her from receiving her influenza vaccine.  Please call me if you have any additional questions.   Sincerely,     Arnette Norris, MD

## 2019-09-11 ENCOUNTER — Telehealth: Payer: Self-pay

## 2019-09-11 DIAGNOSIS — Q828 Other specified congenital malformations of skin: Secondary | ICD-10-CM

## 2019-09-11 NOTE — Telephone Encounter (Signed)
TA-Pt is requesting a referral to Derm to see Dr. Camillo Flaming for the skin tags on her left eye lid/plz advise/thx dmf

## 2019-09-11 NOTE — Telephone Encounter (Signed)
Referral placed.

## 2019-11-22 MED FILL — valACYclovir HCL 1 GM TABS: 1 | 4 days supply | Qty: 8 | Fill #1

## 2019-12-09 ENCOUNTER — Ambulatory Visit (INDEPENDENT_AMBULATORY_CARE_PROVIDER_SITE_OTHER): Payer: 59 | Admitting: Family Medicine

## 2019-12-09 ENCOUNTER — Other Ambulatory Visit: Payer: Self-pay

## 2019-12-09 DIAGNOSIS — M546 Pain in thoracic spine: Secondary | ICD-10-CM | POA: Diagnosis not present

## 2019-12-09 MED ORDER — MELOXICAM 7.5 MG PO TABS: 7.5000 mg | ORAL_TABLET | Freq: Every day | ORAL | 1 refills | Status: DC

## 2019-12-09 MED FILL — MELOXICAM 7.5 MG TABLET: 7.5 | 90 days supply | Qty: 90 | Fill #0

## 2019-12-09 NOTE — Assessment & Plan Note (Signed)
Pain is acutely been aggravated.  Seems more muscular in nature. -Mobic. -Counseled on home exercise therapy and supportive care. -If no improvement can consider physical therapy or imaging.

## 2019-12-09 NOTE — Progress Notes (Signed)
Virtual Visit via Telephone Note  I connected with Allison Logan on 12/09/19 at  4:10 PM EST by telephone and verified that I am speaking with the correct person using two identifiers.   I discussed the limitations, risks, security and privacy concerns of performing an evaluation and management service by telephone and the availability of in person appointments. I also discussed with the patient that there may be a patient responsible charge related to this service. The patient expressed understanding and agreed to proceed.   History of Present Illness:  Ms. Allison Logan is a 39 year old female is presenting with acute on chronic back pain.  Denies any specific inciting event.  Pain seems to be sharp in nature.  Seems to be worse with certain activities that involve lifting.  No radicular symptoms.   Assessment and Plan:  Thoracic back pain Pain is acutely been aggravated.  Seems more muscular in nature. -Mobic. -Counseled on home exercise therapy and supportive care. -If no improvement can consider physical therapy or imaging.  Follow Up Instructions:    I discussed the assessment and treatment plan with the patient. The patient was provided an opportunity to ask questions and all were answered. The patient agreed with the plan and demonstrated an understanding of the instructions.   The patient was advised to call back or seek an in-person evaluation if the symptoms worsen or if the condition fails to improve as anticipated.  I provided 5 minutes of non-face-to-face time during this encounter.   Clearance Coots, MD

## 2020-03-03 ENCOUNTER — Other Ambulatory Visit: Payer: Self-pay

## 2020-03-03 ENCOUNTER — Ambulatory Visit (INDEPENDENT_AMBULATORY_CARE_PROVIDER_SITE_OTHER): Payer: 59 | Admitting: Family

## 2020-03-03 ENCOUNTER — Other Ambulatory Visit: Payer: Self-pay | Admitting: Family

## 2020-03-03 ENCOUNTER — Encounter: Payer: Self-pay | Admitting: Family

## 2020-03-03 VITALS — BP 106/71 | HR 61 | Temp 98.7°F | Resp 16 | Ht 59.5 in | Wt 112.0 lb

## 2020-03-03 DIAGNOSIS — H6982 Other specified disorders of Eustachian tube, left ear: Secondary | ICD-10-CM

## 2020-03-03 DIAGNOSIS — B001 Herpesviral vesicular dermatitis: Secondary | ICD-10-CM

## 2020-03-03 MED ORDER — VALACYCLOVIR HCL 1 G PO TABS
ORAL_TABLET | ORAL | 5 refills | Status: DC
Start: 2020-03-03 — End: 2020-03-03

## 2020-03-03 MED FILL — valACYclovir HCL 1 GM TABS: 1 | 1 days supply | Qty: 4 | Fill #0

## 2020-03-03 NOTE — Progress Notes (Signed)
Subjective:    Patient ID: Allison Logan, female    DOB: 06/18/1981, 40 y.o.   MRN: [id number]  HPI  Patient is a 39 yr old female who presents today with chief complaint of cold sores on her lip she woke up with cold sores this AM. She had one tablet of valtrex on hand which she thinks helped.  She reports that this AM she developed some left ear pain. Ear hurt enough to wake her up from her sleep. Took an ear drop for pain that her boyfriend had which did not help significantly. She reports chronic allergies for which she takes daily claritin. She denies sore throat or fever.   Review of Systems See HPI  No past medical history on file.   Social History   Socioeconomic History  . Marital status: Single    Spouse name: Not on file  . Number of children: Not on file  . Years of education: Not on file  . Highest education level: Not on file  Occupational History  . Not on file  Tobacco Use  . Smoking status: Former Research scientist (life sciences)  . Smokeless tobacco: Never Used  Substance and Sexual Activity  . Alcohol use: Yes    Comment: 1-2 glasses wine/day  . Drug use: No  . Sexual activity: Yes    Birth control/protection: I.U.D.  Other Topics Concern  . Not on file  Social History Narrative  . Not on file   Social Determinants of Health   Financial Resource Strain:   . Difficulty of Paying Living Expenses:   Food Insecurity:   . Worried About Charity fundraiser in the Last Year:   . Arboriculturist in the Last Year:   Transportation Needs:   . Film/video editor (Medical):   Marland Kitchen Lack of Transportation (Non-Medical):   Physical Activity:   . Days of Exercise per Week:   . Minutes of Exercise per Session:   Stress:   . Feeling of Stress :   Social Connections:   . Frequency of Communication with Friends and Family:   . Frequency of Social Gatherings with Friends and Family:   . Attends Religious Services:   . Active Member of Clubs or Organizations:   . Attends Theatre manager Meetings:   Marland Kitchen Marital Status:   Intimate Partner Violence:   . Fear of Current or Ex-Partner:   . Emotionally Abused:   Marland Kitchen Physically Abused:   . Sexually Abused:     Past Surgical History:  Procedure Laterality Date  . COSMETIC SURGERY      No family history on file.  Allergies  Allergen Reactions  . Latex Swelling    Swelling and Rash Swelling and Rash   . Influenza Vaccines   . Tetracyclines & Related Swelling and Rash    Current Outpatient Medications on File Prior to Visit  Medication Sig Dispense Refill  . PARAGARD INTRAUTERINE COPPER IUD IUD 1 each by Intrauterine route once.    . meloxicam (MOBIC) 7.5 MG tablet Take 1 tablet (7.5 mg total) by mouth daily. (Patient not taking: Reported on 03/03/2020) 90 tablet 1   No current facility-administered medications on file prior to visit.    BP 106/71 (BP Location: Left Arm, Patient Position: Sitting, Cuff Size: Small)   Pulse 61   Temp 98.7 F (37.1 C) (Temporal)   Resp 16   Ht 4' 11.5" (1.511 m)   Wt 112 lb (50.8 kg)   SpO2 100%  BMI 22.24 kg/m       Objective:   Physical Exam Constitutional:      Appearance: She is well-developed.  HENT:     Right Ear: Tympanic membrane and ear canal normal. Tympanic membrane is not erythematous.     Left Ear: Ear canal normal. Tympanic membrane is not erythematous.     Ears:     Comments: Clear serous fluid noted behind the left TM.     Mouth/Throat:     Comments: Some early cold sores noted on upper lip Pulmonary:     Effort: Pulmonary effort is normal.  Psychiatric:        Behavior: Behavior normal.        Thought Content: Thought content normal.        Judgment: Judgment normal.           Assessment & Plan:  Cold sores- will rx with valtrex.  Eustachian tube dysfunction- no obvious infection. Advised pt to continue claritin and and add a nasal steroid. She has some nasocort at home which she will start.   This visit occurred during the  SARS-CoV-2 public health emergency.  Safety protocols were in place, including screening questions prior to the visit, additional usage of staff PPE, and extensive cleaning of exam room while observing appropriate contact time as indicated for disinfecting solutions.

## 2020-05-14 ENCOUNTER — Ambulatory Visit: Payer: 59 | Admitting: Family Medicine

## 2020-05-20 MED FILL — valACYclovir HCL 1 GM TABS: 1 | 1 days supply | Qty: 4 | Fill #1

## 2020-06-09 MED FILL — valACYclovir HCL 1 GM TABS: 1 | 1 days supply | Qty: 4 | Fill #2

## 2020-06-16 ENCOUNTER — Telehealth: Payer: 59 | Admitting: Nurse Practitioner

## 2020-06-16 DIAGNOSIS — S70362A Insect bite (nonvenomous), left thigh, initial encounter: Secondary | ICD-10-CM

## 2020-06-16 DIAGNOSIS — W57XXXA Bitten or stung by nonvenomous insect and other nonvenomous arthropods, initial encounter: Secondary | ICD-10-CM

## 2020-06-16 NOTE — Progress Notes (Signed)
E Visit for Insect Sting  Thank you for describing the insect sting/bite for [place].  Here is how we plan to help!  An uncomplicated insect sting/bite that just occurred and can be closely followed using the instructions in your care plan.  The 2 greatest risks from insect stings/bites are allergic reaction, which can be fatal in some people and infection, which is more common and less serious.  Bees, wasps, yellow jackets, and hornets belong to a class of insects called Hymenoptera.  Most insect stings cause only minor discomfort.  Stings can happen anywhere on the body and can be painful.  Most stings are from honey bees or yellow jackets.  Fire ants can sting multiple times.  The sites of the stings are more likely to become infected.   Bites can occur from many insects and usually just cause irritation. They can sometimes get infected  According to the picture you sent, I do not see any sign of infection at this time that would warrant an antibiotic.  Based on your information I have: and Provided a home care guide for insect stings and instructions on when to call for help.  What can be used to prevent Insect Stings?   Insect repellant with at least 20% DEET.    Wearing long pants and shirts with socks and shoes.    Wear dark or drab-colored clothes rather than bright colors.    Avoid using perfumes and hair sprays; these attract insects.  HOME CARE ADVICE:  1. Stinger removal:  The stinger looks like a tiny black dot in the sting.  Use a fingernail, credit card edge, or knife-edge to scrape it off.  Don't pull it out because it squeezes out more venom.  If the stinger is below the skin surface, leave it alone.  It will be shed with normal skin healing. 2. Use cold compresses to the area of the sting for 10-20 minutes.  You may repeat this as needed to relieve symptoms of pain and swelling. 3.  For pain relief, take acetominophen 650 mg 4-6 hours as needed or ibuprofen 400  mg every 6-8 hours as needed or naproxen 250-500 mg every 12 hours as needed. 4.  You can also use hydrocortisone cream 0.5% or 1% up to 4 times daily as needed for itching. 5.  If the sting becomes very itchy, take Benadryl 25-50 mg, follow directions on box. 6.  Wash the area 2-3 times daily with antibacterial soap and warm water. 7. Call your Doctor if:  Fever, a severe headache, or rash occur in the next 2 weeks.  Sting area begins to look infected.  Redness and swelling worsens after home treatment.  Your current symptoms become worse.    MAKE SURE YOU:   Understand these instructions.  Will watch your condition.  Will get help right away if you are not doing well or get worse.  Thank you for choosing an e-visit. Your e-visit answers were reviewed by a board certified advanced clinical practitioner to complete your personal care plan. Depending upon the condition, your plan could have included both over the counter or prescription medications. Please review your pharmacy choice. Be sure that the pharmacy you have chosen is open so that you can pick up your prescription now.  If there is a problem you may message your provider in [employer] to have the prescription routed to another pharmacy. Your safety is important to [place]. If you have drug allergies check your prescription carefully.  For  the next 24 hours, you can use MyChart to ask questions about today's visit, request a non-urgent call back, or ask for a work or school excuse from your e-visit provider. You will get an email in the next two days asking about your experience. I hope that your e-visit has been valuable and will speed your recovery.  5-10 minutes spent reviewing and documenting in chart.

## 2020-07-17 ENCOUNTER — Encounter: Payer: Self-pay | Admitting: Family Medicine

## 2020-07-17 ENCOUNTER — Other Ambulatory Visit: Payer: Self-pay

## 2020-07-17 ENCOUNTER — Telehealth (INDEPENDENT_AMBULATORY_CARE_PROVIDER_SITE_OTHER): Payer: PRIVATE HEALTH INSURANCE | Admitting: Family Medicine

## 2020-07-17 ENCOUNTER — Other Ambulatory Visit: Payer: Self-pay | Admitting: Family Medicine

## 2020-07-17 VITALS — HR 88 | Temp 98.6°F

## 2020-07-17 DIAGNOSIS — J0100 Acute maxillary sinusitis, unspecified: Secondary | ICD-10-CM | POA: Diagnosis not present

## 2020-07-17 MED ORDER — AMOXICILLIN-POT CLAVULANATE 875-125 MG PO TABS: 1.0000 | ORAL_TABLET | Freq: Two times a day (BID) | ORAL | 0 refills | Status: DC

## 2020-07-17 MED ORDER — FLUCONAZOLE 150 MG PO TABS: ORAL_TABLET | ORAL | 0 refills | Status: DC

## 2020-07-17 MED FILL — AMOX-CLAV 875-125 MG TABLET: 875-125 | 7 days supply | Qty: 14 | Fill #0

## 2020-07-17 MED FILL — FLUCONAZOLE 150 MG TABS: 150 | 2 days supply | Qty: 2 | Fill #0

## 2020-07-17 NOTE — Progress Notes (Signed)
Chief Complaint  Patient presents with  . Cough  . Nasal Congestion  . Fever  . Sinusitis    Allison Logan here for URI complaints. Due to COVID-19 pandemic, we are interacting via web portal for an electronic face-to-face visit. I verified patient's ID using 2 identifiers. Patient agreed to proceed with visit via this method. Patient is at home, I am at office. Patient and I are present for visit.   Duration: 9 days  Associated symptoms: Fever (100.2 F on ibuprofen), sinus congestion, sinus pain, rhinorrhea, itchy watery eyes, ear pain, sore throat and cough Denies: ear drainage, wheezing, shortness of breath, myalgia and N/V Treatment to date: amoxicillin (expired), Claritin, Nasocort, ibuprofen Sick contacts: No  No known health history.   Pulse 88   Temp 98.6 F (37 C) (Oral)   SpO2 98%  No conversational dyspnea Age appropriate judgment and insight Nml affect and mood  Acute maxillary sinusitis, recurrence not specified - Plan: amoxicillin-clavulanate (AUGMENTIN) 875-125 MG tablet, fluconazole (DIFLUCAN) 150 MG tablet  Augmentin for 7 d, bid. Fluconazole if needed.  Continue to push fluids, practice good hand hygiene, cover mouth when coughing. F/u prn. If starting to experience fevers, shaking, or shortness of breath, seek immediate care. Pt voiced understanding and agreement to the plan.  [place], DO 07/17/20 2:32 PM

## 2020-09-09 MED FILL — valACYclovir HCL 1 GM TABS: 1 | 1 days supply | Qty: 4 | Fill #3

## 2020-12-08 MED FILL — valACYclovir HCL 1 GM TABS: 1 | 1 days supply | Qty: 4 | Fill #4

## 2021-01-05 ENCOUNTER — Other Ambulatory Visit (HOSPITAL_BASED_OUTPATIENT_CLINIC_OR_DEPARTMENT_OTHER): Payer: Self-pay

## 2021-01-05 MED ORDER — VALACYCLOVIR HCL 1 G PO TABS
ORAL_TABLET | ORAL | 1 refills | Status: DC
  Filled 2021-01-05: qty 8, 2d supply, fill #0
  Filled 2021-08-04: qty 8, 2d supply, fill #1

## 2021-02-26 ENCOUNTER — Other Ambulatory Visit (HOSPITAL_BASED_OUTPATIENT_CLINIC_OR_DEPARTMENT_OTHER): Payer: Self-pay

## 2021-02-26 ENCOUNTER — Other Ambulatory Visit: Payer: Self-pay

## 2021-02-26 ENCOUNTER — Telehealth (INDEPENDENT_AMBULATORY_CARE_PROVIDER_SITE_OTHER): Payer: PRIVATE HEALTH INSURANCE | Admitting: Family

## 2021-02-26 DIAGNOSIS — J019 Acute sinusitis, unspecified: Secondary | ICD-10-CM | POA: Diagnosis not present

## 2021-02-26 DIAGNOSIS — H6690 Otitis media, unspecified, unspecified ear: Secondary | ICD-10-CM | POA: Diagnosis not present

## 2021-02-26 MED ORDER — AMOXICILLIN-POT CLAVULANATE 875-125 MG PO TABS
1.0000 | ORAL_TABLET | Freq: Two times a day (BID) | ORAL | 0 refills | Status: DC
Start: 2021-02-26 — End: 2022-02-02
  Filled 2021-02-26: qty 20, 10d supply, fill #0

## 2021-02-26 MED ORDER — FLUCONAZOLE 150 MG PO TABS
150.0000 mg | ORAL_TABLET | Freq: Once | ORAL | 0 refills | Status: AC
  Filled 2021-02-26: qty 1, 1d supply, fill #0

## 2021-02-26 NOTE — Progress Notes (Signed)
MyChart Video Visit    Virtual Visit via Video Note   This visit type was conducted due to national recommendations for restrictions regarding the COVID-19 Pandemic (e.g. social distancing) in an effort to limit this patient's exposure and mitigate transmission in our community. This patient is at least at moderate risk for complications without adequate follow up. This format is felt to be most appropriate for this patient at this time. Physical exam was limited by quality of the video and audio technology used for the visit. CMA was able to get the patient set up on a video visit.  Patient location: home Patient and provider in visit Provider location: Office  I discussed the limitations of evaluation and management by telemedicine and the availability of in person appointments. The patient expressed understanding and agreed to proceed.  Visit Date: 02/26/2021  Today's healthcare provider: Nance Pear, NP     Subjective:    Patient ID: Allison Logan, female    DOB: Oct 14, 1980, 40 y.o.   MRN: [id number]  Chief Complaint  Patient presents with  . Sinus Problem    Patient complains of sinus congestion  . Sore Throat    Complains of sore throat with left ear ache    HPI Patient is in today due to complaint of "sinus infection.:  Symptoms began 5 days ago but have worsened. She now has increased congestion, left facial pressure/pain and left ear pain. Has had two neg covid tests. She is using an antihistamine, flonase, nasal saline spray.   No past medical history on file.  Past Surgical History:  Procedure Laterality Date  . COSMETIC SURGERY      No family history on file.  Social History   Socioeconomic History  . Marital status: Single    Spouse name: Not on file  . Number of children: Not on file  . Years of education: Not on file  . Highest education level: Not on file  Occupational History  . Not on file  Tobacco Use  . Smoking status:  Former Research scientist (life sciences)  . Smokeless tobacco: Never Used  Substance and Sexual Activity  . Alcohol use: Yes    Comment: 1-2 glasses wine/day  . Drug use: No  . Sexual activity: Yes    Birth control/protection: I.U.D.  Other Topics Concern  . Not on file  Social History Narrative  . Not on file   Social Determinants of Health   Financial Resource Strain: Not on file  Food Insecurity: Not on file  Transportation Needs: Not on file  Physical Activity: Not on file  Stress: Not on file  Social Connections: Not on file  Intimate Partner Violence: Not on file    Outpatient Medications Prior to Visit  Medication Sig Dispense Refill  . meloxicam (MOBIC) 7.5 MG tablet Take 1 tablet (7.5 mg total) by mouth daily. 90 tablet 1  . PARAGARD INTRAUTERINE COPPER IUD IUD 1 each by Intrauterine route once.    . valACYclovir (VALTREX) 1000 MG tablet Take 2 tablets BY MOUTH EVERY 12 hours for 1 day. 8 tablet 1  . amoxicillin-clavulanate (AUGMENTIN) 875-125 MG tablet TAKE 1 TABLET BY MOUTH TWICE DAILY FOR 7 DAYS 14 tablet 0  . fluconazole (DIFLUCAN) 150 MG tablet TAKE 1 TABLET BY MOUTH MAY REPEAT IN 48 HOURS IF NO IMPROVEMENT 2 tablet 0  . valACYclovir (VALTREX) 1000 MG tablet TAKE 2 TABLETS BY MOUTH NOW AND THEN 2 TABLETS BY MOUTH IN 12 HOURS 4 tablet 5  No facility-administered medications prior to visit.    Allergies  Allergen Reactions  . Latex Swelling    Swelling and Rash Swelling and Rash   . Influenza Vaccines   . Tetracyclines & Related Swelling and Rash    ROS     Objective:    Physical Exam Constitutional:      Appearance: She is well-developed.  HENT:     Nose: Congestion present.  Pulmonary:     Effort: Pulmonary effort is normal.  Neurological:     Mental Status: She is alert.  Psychiatric:        Thought Content: Thought content normal.        Judgment: Judgment normal.     There were no vitals taken for this visit. Wt Readings from Last 3 Encounters:  03/03/20 112  lb (50.8 kg)  04/18/18 112 lb (50.8 kg)  02/28/16 109 lb (49.4 kg)    Diabetic Foot Exam - Simple   No data filed    Lab Results  Component Value Date   WBC 5.2 11/24/2014   HGB 12.8 11/24/2014   HCT 36.8 11/24/2014   PLT 325 11/24/2014   GLUCOSE 117 (H) 04/12/2017   NA 138 04/12/2017   K 3.7 04/12/2017   CL 102 04/12/2017   CREATININE 0.80 04/12/2017   BUN 11 04/12/2017   CO2 28 04/12/2017    No results found for: TSH Lab Results  Component Value Date   WBC 5.2 11/24/2014   HGB 12.8 11/24/2014   HCT 36.8 11/24/2014   MCV 94.6 11/24/2014   PLT 325 11/24/2014   Lab Results  Component Value Date   NA 138 04/12/2017   K 3.7 04/12/2017   CO2 28 04/12/2017   GLUCOSE 117 (H) 04/12/2017   BUN 11 04/12/2017   CREATININE 0.80 04/12/2017   CALCIUM 9.7 04/12/2017   ANIONGAP 3 (L) 11/24/2014   GFR 86.12 04/12/2017   No results found for: CHOL No results found for: HDL No results found for: LDLCALC No results found for: TRIG No results found for: CHOLHDL No results found for: HGBA1C     Assessment & Plan:   Problem List Items Addressed This Visit      Unprioritized   Acute sinusitis - Primary   Relevant Medications   amoxicillin-clavulanate (AUGMENTIN) 875-125 MG tablet   fluconazole (DIFLUCAN) 150 MG tablet   Acute otitis media   Relevant Medications   amoxicillin-clavulanate (AUGMENTIN) 875-125 MG tablet   fluconazole (DIFLUCAN) 150 MG tablet     Meds ordered this encounter  Medications  . amoxicillin-clavulanate (AUGMENTIN) 875-125 MG tablet    Sig: Take 1 tablet by mouth 2 (two) times daily.    Dispense:  20 tablet    Refill:  0    Order Specific Question:   Supervising Provider    Answer:   Penni Homans A [[id number]]  . fluconazole (DIFLUCAN) 150 MG tablet    Sig: Take 1 tablet (150 mg total) by mouth once for 1 dose.    Dispense:  1 tablet    Refill:  0    Order Specific Question:   Supervising Provider    Answer:   Penni Homans A [[id number]]  as  needed for vaginal yeast infection.  I discussed the assessment and treatment plan with the patient. The patient was provided an opportunity to ask questions and all were answered. The patient agreed with the plan and demonstrated an understanding of the instructions.   The patient was advised to call  back or seek an in-person evaluation if the symptoms worsen or if the condition fails to improve as anticipated.  Nance Pear, NP [employer] [phone] (phone) [phone] (fax)  [place]

## 2021-08-04 ENCOUNTER — Other Ambulatory Visit (HOSPITAL_BASED_OUTPATIENT_CLINIC_OR_DEPARTMENT_OTHER): Payer: Self-pay

## 2021-08-30 ENCOUNTER — Other Ambulatory Visit (HOSPITAL_BASED_OUTPATIENT_CLINIC_OR_DEPARTMENT_OTHER): Payer: Self-pay

## 2021-08-30 ENCOUNTER — Telehealth (INDEPENDENT_AMBULATORY_CARE_PROVIDER_SITE_OTHER): Payer: No Typology Code available for payment source | Admitting: Family Medicine

## 2021-08-30 ENCOUNTER — Encounter: Payer: Self-pay | Admitting: Family Medicine

## 2021-08-30 DIAGNOSIS — S39012A Strain of muscle, fascia and tendon of lower back, initial encounter: Secondary | ICD-10-CM | POA: Diagnosis not present

## 2021-08-30 MED ORDER — PREDNISONE 5 MG PO TABS
ORAL_TABLET | ORAL | 0 refills | Status: DC
  Filled 2021-08-30: qty 21, 6d supply, fill #0

## 2021-08-30 MED ORDER — METHOCARBAMOL 500 MG PO TABS
500.0000 mg | ORAL_TABLET | Freq: Three times a day (TID) | ORAL | 1 refills | Status: DC
  Filled 2021-08-30: qty 90, 30d supply, fill #0

## 2021-08-30 NOTE — Progress Notes (Signed)
Virtual Visit via Video Note  I connected with Allison Logan on 08/30/21 at 10:50 AM EST by a video enabled telemedicine application and verified that I am speaking with the correct person using two identifiers.  Location: Patient: home Provider: office   I discussed the limitations of evaluation and management by telemedicine and the availability of in person appointments. The patient expressed understanding and agreed to proceed.  History of Present Illness:  Ms. Allison Logan is a 40 year old female that is presenting with acute low back pain.  She was tossing around her little niece and felt an acute pain.  She is having pain to the gluteus.  Did get some improvement with Robaxin that she had at home.  Observations/Objective:   Assessment and Plan:  Strain of lumbar region: Having severe pain that is acutely occurring on the lower portion that seems more likely to spasm and strain. -Counseled on home exercise therapy and supportive care. -Robaxin and prednisone. -Could consider imaging or physical therapy.  Follow Up Instructions:    I discussed the assessment and treatment plan with the patient. The patient was provided an opportunity to ask questions and all were answered. The patient agreed with the plan and demonstrated an understanding of the instructions.   The patient was advised to call back or seek an in-person evaluation if the symptoms worsen or if the condition fails to improve as anticipated.    Clearance Coots, MD

## 2021-08-30 NOTE — Assessment & Plan Note (Signed)
Having severe pain that is acutely occurring on the lower portion that seems more likely to spasm and strain. -Counseled on home exercise therapy and supportive care. -Robaxin and prednisone. -Could consider imaging or physical therapy.

## 2021-10-13 ENCOUNTER — Other Ambulatory Visit (HOSPITAL_BASED_OUTPATIENT_CLINIC_OR_DEPARTMENT_OTHER): Payer: Self-pay

## 2021-10-13 MED ORDER — VALACYCLOVIR HCL 1 G PO TABS
ORAL_TABLET | ORAL | 1 refills | Status: DC
  Filled 2021-10-13: qty 30, 30d supply, fill #0

## 2021-11-22 ENCOUNTER — Ambulatory Visit (INDEPENDENT_AMBULATORY_CARE_PROVIDER_SITE_OTHER): Payer: No Typology Code available for payment source | Admitting: Family Medicine

## 2021-11-22 ENCOUNTER — Encounter: Payer: Self-pay | Admitting: Family Medicine

## 2021-11-22 VITALS — BP 110/68 | HR 59 | Temp 98.3°F | Ht 59.0 in | Wt 111.0 lb

## 2021-11-22 DIAGNOSIS — S060X0A Concussion without loss of consciousness, initial encounter: Secondary | ICD-10-CM

## 2021-11-22 NOTE — Progress Notes (Addendum)
Chief Complaint  Patient presents with   Headache          Hit head twice on 11/13/21      Subjective: Patient is a 41 y.o. female here for headache.  2 weeks ago she hit her head on the door while leaning over. She has had a lingering headache since then. Initially she had N/V for a few nights but that has resolved. She is back working out without much issue. She is having worsening s/s's w looking at a screen/working/concentrating. Balance is fair. Mood more irritable. Sleeping more than before.   History reviewed. No pertinent past medical history.  Objective: BP 110/68    Pulse (!) 59    Temp 98.3 F (36.8 C) (Oral)    Ht 4\' 11"  (1.499 m)    Wt 111 lb (50.3 kg)    SpO2 99%    BMI 22.42 kg/m  General: Awake, appears stated age Eyes: PERRLA, EOMi Neuro: DTRs equal and symmetric throughout, no clonus, no cerebellar signs, 5/5 strength throughout, gait nml MSK: No ttp over temporalis or cerv parasp msc, or subocc triangle; +ttp over the L trap Lungs: No accessory muscle use Psych: Age appropriate judgment and insight, normal affect and mood  Assessment and Plan: Concussion without loss of consciousness, initial encounter  Steadily improving. Discussed above and to avoid aggravating triggers. Listen to body regarding sleep. OTC supplements to help s/s's provided. Stretches/exercises for trap provided.  F/u as originally scheduled. The patient voiced understanding and agreement to the plan.  [place], DO 11/22/21  3:52 PM

## 2021-11-22 NOTE — Patient Instructions (Addendum)
Fish oil 3 grams daily for 10 days then 2 grams daily  Vitamin D 4000 IU daily  CoQ10 200mg  daily for headaches  Tart cherry extract any dose at night  To help reduce HEADACHES: Coenzyme Q10 160mg  ONCE DAILY Riboflavin/Vitamin B2 400mg  ONCE DAILY Magnesium oxide 400 mg ONE-TWO TIMES DAILY May stop after headaches are resolved.    Avoid aggravating triggers to your headache. Listen to your body with sleep.     Let us know if you need anything.  Trapezius stretches/exercises Do exercises exactly as told by your health care provider and adjust them as directed. It is normal to feel mild stretching, pulling, tightness, or discomfort as you do these exercises, but you should stop right away if you feel sudden pain or your pain gets worse.   Stretching and range of motion exercises These exercises warm up your muscles and joints and improve the movement and flexibility of your shoulder. These exercises can also help to relieve pain, numbness, and tingling. If you are unable to do any of the following for any reason, do not further attempt to do it.   Exercise A: Flexion, standing     Stand and hold a broomstick, a cane, or a similar object. Place your hands a little more than shoulder-width apart on the object. Your left / right hand should be palm-up, and your other hand should be palm-down. Push the stick to raise your left / right arm out to your side and then over your head. Use your other hand to help move the stick. Stop when you feel a stretch in your shoulder, or when you reach the angle that is recommended by your health care provider. Avoid shrugging your shoulder while you raise your arm. Keep your shoulder blade tucked down toward your spine. Hold for 30 seconds. Slowly return to the starting position. Repeat 2 times. Complete this exercise 3 times per week.  Exercise B: Abduction, supine     Lie on your back and hold a broomstick, a cane, or a similar object. Place your hands  a little more than shoulder-width apart on the object. Your left / right hand should be palm-up, and your other hand should be palm-down. Push the stick to raise your left / right arm out to your side and then over your head. Use your other hand to help move the stick. Stop when you feel a stretch in your shoulder, or when you reach the angle that is recommended by your health care provider. Avoid shrugging your shoulder while you raise your arm. Keep your shoulder blade tucked down toward your spine. Hold for 30 seconds. Slowly return to the starting position. Repeat 2 times. Complete this exercise 3 times per week.  Exercise C: Flexion, active-assisted     Lie on your back. You may bend your knees for comfort. Hold a broomstick, a cane, or a similar object. Place your hands about shoulder-width apart on the object. Your palms should face toward your feet. Raise the stick and move your arms over your head and behind your head, toward the floor. Use your healthy arm to help your left / right arm move farther. Stop when you feel a gentle stretch in your shoulder, or when you reach the angle where your health care provider tells you to stop. Hold for 30 seconds. Slowly return to the starting position. Repeat 2 times. Complete this exercise 3 times per week.  Exercise D: External rotation and abduction  Stand in a door frame with one of your feet slightly in front of the other. This is called a staggered stance. Choose one of the following positions as told by your health care provider: Place your hands and forearms on the door frame above your head. Place your hands and forearms on the door frame at the height of your head. Place your hands on the door frame at the height of your elbows. Slowly move your weight onto your front foot until you feel a stretch across your chest and in the front of your shoulders. Keep your head and chest upright and keep your abdominal muscles tight. Hold for  30 seconds. To release the stretch, shift your weight to your back foot. Repeat 2 times. Complete this stretch 3 times per week.  Strengthening exercises These exercises build strength and endurance in your shoulder. Endurance is the ability to use your muscles for a long time, even after your muscles get tired. Exercise E: Scapular depression and adduction  Sit on a stable chair. Support your arms in front of you with pillows, armrests, or a tabletop. Keep your elbows in line with the sides of your body. Gently move your shoulder blades down toward your middle back. Relax the muscles on the tops of your shoulders and in the back of your neck. Hold for 3 seconds. Slowly release the tension and relax your muscles completely before doing this exercise again. Repeat for a total of 10 repetitions. After you have practiced this exercise, try doing the exercise without the arm support. Then, try the exercise while standing instead of sitting. Repeat 2 times. Complete this exercise 3 times per week.  Exercise F: Shoulder abduction, isometric     Stand or sit about 4-6 inches (10-15 cm) from a wall with your left / right side facing the wall. Bend your left / right elbow and gently press your elbow against the wall. Increase the pressure slowly until you are pressing as hard as you can without shrugging your shoulder. Hold for 3 seconds. Slowly release the tension and relax your muscles completely. Repeat for a total of 10 repetitions. Repeat 2 times. Complete this exercise 3 times per week.  Exercise G: Shoulder flexion, isometric     Stand or sit about 4-6 inches (10-15 cm) away from a wall with your left / right side facing the wall. Keep your left / right elbow straight and gently press the top of your fist against the wall. Increase the pressure slowly until you are pressing as hard as you can without shrugging your shoulder. Hold for 10-15 seconds. Slowly release the tension and relax  your muscles completely. Repeat for a total of 10 repetitions. Repeat 2 times. Complete this exercise 3 times per week.  Exercise H: Internal rotation     Sit in a stable chair without armrests, or stand. Secure an exercise band at your left / right side, at elbow height. Place a soft object, such as a folded towel or a small pillow, under your left / right upper arm so your elbow is a few inches (about 8 cm) away from your side. Hold the end of the exercise band so the band stretches. Keeping your elbow pressed against the soft object under your arm, move your forearm across your body toward your abdomen. Keep your body steady so the movement is only coming from your shoulder. Hold for 3 seconds. Slowly return to the starting position. Repeat for a total of 10 repetitions.  Repeat 2 times. Complete this exercise 3 times per week.  Exercise I: External rotation     Sit in a stable chair without armrests, or stand. Secure an exercise band at your left / right side, at elbow height. Place a soft object, such as a folded towel or a small pillow, under your left / right upper arm so your elbow is a few inches (about 8 cm) away from your side. Hold the end of the exercise band so the band stretches. Keeping your elbow pressed against the soft object under your arm, move your forearm out, away from your abdomen. Keep your body steady so the movement is only coming from your shoulder. Hold for 3 seconds. Slowly return to the starting position. Repeat for a total of 10 repetitions. Repeat 2 times. Complete this exercise 3 times per week. Exercise J: Shoulder extension  Sit in a stable chair without armrests, or stand. Secure an exercise band to a stable object in front of you so the band is at shoulder height. Hold one end of the exercise band in each hand. Your palms should face each other. Straighten your elbows and lift your hands up to shoulder height. Step back, away from the secured end of  the exercise band, until the band stretches. Squeeze your shoulder blades together and pull your hands down to the sides of your thighs. Stop when your hands are straight down by your sides. Do not let your hands go behind your body. Hold for 3 seconds. Slowly return to the starting position. Repeat for a total of 10 repetitions. Repeat 2 times. Complete this exercise 3 times per week.  Exercise K: Shoulder extension, prone     Lie on your abdomen on a firm surface so your left / right arm hangs over the edge. Hold a 5 lb weight in your hand so your palm faces in toward your body. Your arm should be straight. Squeeze your shoulder blade down toward the middle of your back. Slowly raise your arm behind you, up to the height of the surface that you are lying on. Keep your arm straight. Hold for 3 seconds. Slowly return to the starting position and relax your muscles. Repeat for a total of 10 repetitions. Repeat 2 times. Complete this exercise 3 times per week.   Exercise L: Horizontal abduction, prone  Lie on your abdomen on a firm surface so your left / right arm hangs over the edge. Hold a 5 lb weight in your hand so your palm faces toward your feet. Your arm should be straight. Squeeze your shoulder blade down toward the middle of your back. Bend your elbow so your hand moves up, until your elbow is bent to an "L" shape (90 degrees). With your elbow bent, slowly move your forearm forward and up. Raise your hand up to the height of the surface that you are lying on. Your upper arm should not move, and your elbow should stay bent. At the top of the movement, your palm should face the floor. Hold for 3 seconds. Slowly return to the starting position and relax your muscles. Repeat for a total of 10 repetitions. Repeat 2 times. Complete this exercise 3 times per week.  Exercise M: Horizontal abduction, standing  Sit on a stable chair, or stand. Secure an exercise band to a stable object in  front of you so the band is at shoulder height. Hold one end of the exercise band in each hand. Straighten your elbows  and lift your hands straight in front of you, up to shoulder height. Your palms should face down, toward the floor. Step back, away from the secured end of the exercise band, until the band stretches. Move your arms out to your sides, and keep your arms straight. Hold for 3 seconds. Slowly return to the starting position. Repeat for a total of 10 repetitions. Repeat 2 times. Complete this exercise 3 times per week.  Exercise N: Scapular retraction and elevation  Sit on a stable chair, or stand. Secure an exercise band to a stable object in front of you so the band is at shoulder height. Hold one end of the exercise band in each hand. Your palms should face each other. Sit in a stable chair without armrests, or stand. Step back, away from the secured end of the exercise band, until the band stretches. Squeeze your shoulder blades together and lift your hands over your head. Keep your elbows straight. Hold for 3 seconds. Slowly return to the starting position. Repeat for a total of 10 repetitions. Repeat 2 times. Complete this exercise 3 times per week.  This information is not intended to replace advice given to you by your health care provider. Make sure you discuss any questions you have with your health care provider. Document Released: 09/19/2005 Document Revised: 05/26/2016 Document Reviewed: 08/06/2015 Elsevier Interactive Patient Education  2017 [employer].

## 2021-11-23 ENCOUNTER — Telehealth: Payer: Self-pay | Admitting: Family

## 2021-11-23 DIAGNOSIS — G4484 Primary exertional headache: Secondary | ICD-10-CM

## 2021-11-23 NOTE — Telephone Encounter (Signed)
Patient would like a call back from [place] or Maudie Mercury regarding her headaches. She did not provide more information, and stated she would text Kim as well. Please advise.

## 2021-11-23 NOTE — Telephone Encounter (Signed)
Called pt to discuss. Worsening HA this AM, worse when she coughs/sneezes. This is new form yesterday. Will ck MRI brain.

## 2021-11-23 NOTE — Telephone Encounter (Signed)
Patient has c/o slight vaginal bleeding (bright red at times) since her fall on 11/14/21.  Typically has regular cycle, to begin in 2 weeks.  Patient is concerned of brain injury and questioning if she needs imaging.

## 2021-12-08 ENCOUNTER — Other Ambulatory Visit: Payer: Self-pay

## 2021-12-08 ENCOUNTER — Ambulatory Visit (HOSPITAL_BASED_OUTPATIENT_CLINIC_OR_DEPARTMENT_OTHER)
Admission: RE | Admit: 2021-12-08 | Discharge: 2021-12-08 | Disposition: A | Discharge status: Home / Self Care | Payer: No Typology Code available for payment source | Source: Ambulatory Visit | Attending: Family Medicine | Admitting: Family Medicine

## 2021-12-08 DIAGNOSIS — G4484 Primary exertional headache: Secondary | ICD-10-CM | POA: Insufficient documentation

## 2022-02-02 ENCOUNTER — Telehealth (INDEPENDENT_AMBULATORY_CARE_PROVIDER_SITE_OTHER): Payer: No Typology Code available for payment source | Admitting: Family

## 2022-02-02 ENCOUNTER — Other Ambulatory Visit (HOSPITAL_BASED_OUTPATIENT_CLINIC_OR_DEPARTMENT_OTHER): Payer: Self-pay

## 2022-02-02 DIAGNOSIS — J019 Acute sinusitis, unspecified: Secondary | ICD-10-CM

## 2022-02-02 MED ORDER — AMOXICILLIN-POT CLAVULANATE 875-125 MG PO TABS
1.0000 | ORAL_TABLET | Freq: Two times a day (BID) | ORAL | 0 refills | Status: DC
  Filled 2022-02-02: qty 20, 10d supply, fill #0

## 2022-02-02 MED ORDER — FLUCONAZOLE 150 MG PO TABS
150.0000 mg | ORAL_TABLET | Freq: Every day | ORAL | 0 refills | Status: DC
  Filled 2022-02-02: qty 1, 1d supply, fill #0

## 2022-02-02 NOTE — Progress Notes (Signed)
? ? ?MyChart Video Visit ? ? ? ?Virtual Visit via Video Note  ? ?This visit type was conducted due to national recommendations for restrictions regarding the COVID-19 Pandemic (e.g. social distancing) in an effort to limit this patient's exposure and mitigate transmission in our community. This patient is at least at moderate risk for complications without adequate follow up. This format is felt to be most appropriate for this patient at this time. Physical exam was limited by quality of the video and audio technology used for the visit. Lorrin Goodell. was able to get the patient set up on a video visit. ? ?Patient location: Home Patient and provider in visit ?Provider location: Office ? ?I discussed the limitations of evaluation and management by telemedicine and the availability of in person appointments. The patient expressed understanding and agreed to proceed. ? ?Visit Date: 02/02/2022 ? ?Today's healthcare provider: Nance Pear, NP  ? ?Subjective:  ? ? Patient ID: Allison Logan, female    DOB: 09-20-1981, 40 y.o.   MRN: [id number] ? ?Chief Complaint  ?Patient presents with  ? Nasal Congestion  ?  Complains of nasal, sinus congestion with thick mucus  ? ? ?HPI ?Patient is in today for a video visit ? ?She complains of congestion, sinus drainage, headaches, sore throat and difficulty breathing through the nose. Symptoms are worsening. Symptoms started on Saturday.  She has sinus infections yearly. She has used Nasacort, ibuprofen and elderberry juice. ? ?No past medical history on file. ? ?Past Surgical History:  ?Procedure Laterality Date  ? COSMETIC SURGERY    ? ? ?No family history on file. ? ?Social History  ? ?Socioeconomic History  ? Marital status: Single  ?  Spouse name: Not on file  ? Number of children: Not on file  ? Years of education: Not on file  ? Highest education level: Not on file  ?Occupational History  ? Not on file  ?Tobacco Use  ? Smoking status: Former  ? Smokeless tobacco: Never   ?Substance and Sexual Activity  ? Alcohol use: Yes  ?  Comment: 1-2 glasses wine/day  ? Drug use: No  ? Sexual activity: Yes  ?  Birth control/protection: I.U.D.  ?Other Topics Concern  ? Not on file  ?Social History Narrative  ? Not on file  ? ?Social Determinants of Health  ? ?Financial Resource Strain: Not on file  ?Food Insecurity: Not on file  ?Transportation Needs: Not on file  ?Physical Activity: Not on file  ?Stress: Not on file  ?Social Connections: Not on file  ?Intimate Partner Violence: Not on file  ? ? ?Outpatient Medications Prior to Visit  ?Medication Sig Dispense Refill  ? meloxicam (MOBIC) 7.5 MG tablet Take 1 tablet (7.5 mg total) by mouth daily. 90 tablet 1  ? methocarbamol (ROBAXIN) 500 MG tablet Take 1 tablet (500 mg total) by mouth 3 (three) times daily. 90 tablet 1  ? PARAGARD INTRAUTERINE COPPER IUD IUD 1 each by Intrauterine route once.    ? valACYclovir (VALTREX) 1000 MG tablet Take 1 tablet by mouth every day 90 tablet 1  ? amoxicillin-clavulanate (AUGMENTIN) 875-125 MG tablet Take 1 tablet by mouth 2 (two) times daily. 20 tablet 0  ? predniSONE (DELTASONE) 5 MG tablet Take 6 tablets by mouth on day 1, then 5 tablets on day 2, then 4 tablets on day 3, then 3 tablets on day 4, then 2 tablets on day 5, then 1 tablet on day 6 21 tablet 0  ? ?  No facility-administered medications prior to visit.  ? ? ?Allergies  ?Allergen Reactions  ? Latex Swelling  ?  Swelling and Rash ?Swelling and Rash ?  ? Influenza Vaccines   ? Tetracyclines & Related Swelling and Rash  ? ? ?Review of Systems  ?HENT:  Positive for congestion and sore throat.   ?     (+) difficulty breathing  ?Neurological:  Positive for headaches.  ? ?   ?Objective:  ?  ?Physical Exam ?Constitutional:   ?   Appearance: Normal appearance. She is not ill-appearing.  ?Neurological:  ?   Mental Status: She is alert and oriented to person, place, and time.  ?Psychiatric:     ?   Mood and Affect: Mood normal.     ?   Behavior: Behavior normal.      ?   Judgment: Judgment normal.  ? ? ?There were no vitals taken for this visit. ?Wt Readings from Last 3 Encounters:  ?11/22/21 111 lb (50.3 kg)  ?03/03/20 112 lb (50.8 kg)  ?04/18/18 112 lb (50.8 kg)  ? ?   ?Assessment & Plan:  ? ?Problem List Items Addressed This Visit   ? ?  ? Unprioritized  ? Acute sinusitis - Primary  ?  New. Will rx with augmentin. Continue Nasacort. Rx sent for diflucan as needed for vaginal yeast infection. Pt is advised to call if symptoms worsen or if symptoms are not improved in 3-4 days. ? ?  ?  ? Relevant Medications  ? amoxicillin-clavulanate (AUGMENTIN) 875-125 MG tablet  ? fluconazole (DIFLUCAN) 150 MG tablet  ? ? ?Meds ordered this encounter  ?Medications  ? amoxicillin-clavulanate (AUGMENTIN) 875-125 MG tablet  ?  Sig: Take 1 tablet by mouth 2 (two) times daily.  ?  Dispense:  20 tablet  ?  Refill:  0  ?  Order Specific Question:   Supervising Provider  ?  Answer:   Penni Homans A [[id number]]  ? fluconazole (DIFLUCAN) 150 MG tablet  ?  Sig: Take 1 tablet (150 mg total) by mouth daily.  ?  Dispense:  1 tablet  ?  Refill:  0  ?  Order Specific Question:   Supervising Provider  ?  Answer:   Penni Homans A [[id number]]  ? ? ?I discussed the assessment and treatment plan with the patient. The patient was provided an opportunity to ask questions and all were answered. The patient agreed with the plan and demonstrated an understanding of the instructions. ?  ?The patient was advised to call back or seek an in-person evaluation if the symptoms worsen or if the condition fails to improve as anticipated. ? ?I provided 20 minutes of face-to-face time during this encounter. ? ? ?I,Zite Okoli,acting as a Education administrator for [employer], NP.,have documented all relevant documentation on the behalf of Nance Pear, NP,as directed by  Nance Pear, NP while in the presence of Nance Pear, NP.  ? ?Nance Pear, NP ?Archivist at [employer] ?[phone] (phone) ?[phone] (fax) ? ?[facility]  ?

## 2022-02-02 NOTE — Assessment & Plan Note (Signed)
New. Will rx with augmentin. Continue Nasacort. Rx sent for diflucan as needed for vaginal yeast infection. Pt is advised to call if symptoms worsen or if symptoms are not improved in 3-4 days. ?

## 2022-05-05 ENCOUNTER — Telehealth: Payer: Self-pay | Admitting: Family

## 2022-05-05 ENCOUNTER — Encounter: Payer: Self-pay | Admitting: Family Medicine

## 2022-05-05 ENCOUNTER — Other Ambulatory Visit (HOSPITAL_BASED_OUTPATIENT_CLINIC_OR_DEPARTMENT_OTHER): Payer: Self-pay

## 2022-05-05 ENCOUNTER — Telehealth (INDEPENDENT_AMBULATORY_CARE_PROVIDER_SITE_OTHER): Payer: No Typology Code available for payment source | Admitting: Family Medicine

## 2022-05-05 DIAGNOSIS — S40812A Abrasion of left upper arm, initial encounter: Secondary | ICD-10-CM

## 2022-05-05 MED ORDER — CEPHALEXIN 500 MG PO CAPS
500.0000 mg | ORAL_CAPSULE | Freq: Two times a day (BID) | ORAL | 0 refills | Status: AC
  Filled 2022-05-05: qty 10, 5d supply, fill #0

## 2022-05-05 MED ORDER — MUPIROCIN 2 % EX OINT
TOPICAL_OINTMENT | CUTANEOUS | 3 refills | Status: DC
  Filled 2022-05-05: qty 22, 7d supply, fill #0

## 2022-05-05 NOTE — Progress Notes (Signed)
Virtual Video Visit via MyChart Note  I connected with  Allison Logan on 05/05/22 at 11:00 AM EDT by the video enabled telemedicine application for MyChart, and verified that I am speaking with the correct person using two identifiers.   I introduced myself as a Designer, jewellery with the practice. We discussed the limitations of evaluation and management by telemedicine and the availability of in person appointments. The patient expressed understanding and agreed to proceed.  Participating parties in this visit include: The patient and the nurse practitioner listed.  The patient is: At home I am: In the office - [facility]  Subjective:    CC: scrape on arm with spreading redness   HPI: Allison Logan is a 41 y.o. year old female presenting today via [place] today for cut on arm with worsening redness.  Patient reports she played in a golf tournament on Monday and that evening when she got home she realized a scrape on her left arm. States she has been keeping it clean and using topical Bactroban at night and letting it air out during the day. States the area of erythema around the wound has started spreading and is now feeling warm and mildly swollen. She has not had any drainage or severe pain. She is not sure what exactly she cut her arm on, but is fairly confident her Tdap is up to date - she will double check and let us know.     Past medical history, Surgical history, Family history not pertinant except as noted below, Social history, Allergies, and medications have been entered into the medical record, reviewed, and corrections made.   Review of Systems:  All review of systems negative except what is listed in the HPI   Objective:    General:  Speaking clearly in complete sentences. Absent shortness of breath noted.   Alert and oriented x3.   Normal judgment.  Absent acute distress. Area of concern difficult to visualize well  due to pixilated video - mild erythema and edema noted around abrasion.    Impression and Recommendations:    1. Abrasion of left upper extremity, initial encounter - cephALEXin (KEFLEX) 500 MG capsule; Take 1 capsule (500 mg total) by mouth 2 (two) times daily for 5 days.  Dispense: 10 capsule; Refill: 0  Area looks very localized for now. Start with twice daily Bactroban (reported you already have some at home), keep clean, warm compresses, etc. Monitor for any worsening symptoms. Since we are going into the weekend, I will send you a "watch and wait" antibiotic in case your symptoms worsen and we are closed - increased pain, spreading/streaking redness, increased warmth and firmness/swelling, etc. If you find the date of your last Tetanus shot and it has been 10 years, then you can schedule a nurse visit to have this updated.   Please contact office for follow-up if symptoms do not improve or worsen. Seek emergency care if symptoms become severe.   Follow-up if symptoms worsen or fail to improve.    I discussed the assessment and treatment plan with the patient. The patient was provided an opportunity to ask questions and all were answered. The patient agreed with the plan and demonstrated an understanding of the instructions.   The patient was advised to call back or seek an in-person evaluation if the symptoms worsen or if the condition fails to improve as anticipated.   Terrilyn Saver, NP

## 2022-05-05 NOTE — Telephone Encounter (Signed)
Pt called stating that she would like to [facility] to call in an Rx for the cream she had as she is almost out and didn't realize when she had her visit today. Pt stated she believed it was [employer]

## 2022-05-05 NOTE — Telephone Encounter (Signed)
Okay to refill? It is on her historical med list.

## 2022-05-05 NOTE — Patient Instructions (Addendum)
Area looks very localized for now. Start with twice daily Bactroban (reported you already have some at home), keep clean, warm compresses, etc. Monitor for any worsening symptoms. Since we are going into the weekend, I will send you a "watch and wait" antibiotic in case your symptoms worsen and we are closed - increased pain, spreading/streaking redness, increased warmth and firmness/swelling, etc. If you find the date of your last Tetanus shot and it has been 10 years, then you can schedule a nurse visit to have this updated.   Please contact office for follow-up if symptoms do not improve or worsen. Seek emergency care if symptoms become severe.

## 2022-06-01 ENCOUNTER — Telehealth (INDEPENDENT_AMBULATORY_CARE_PROVIDER_SITE_OTHER): Payer: No Typology Code available for payment source | Admitting: Family

## 2022-06-01 ENCOUNTER — Other Ambulatory Visit (HOSPITAL_BASED_OUTPATIENT_CLINIC_OR_DEPARTMENT_OTHER): Payer: Self-pay

## 2022-06-01 DIAGNOSIS — Z91018 Allergy to other foods: Secondary | ICD-10-CM | POA: Insufficient documentation

## 2022-06-01 DIAGNOSIS — J019 Acute sinusitis, unspecified: Secondary | ICD-10-CM

## 2022-06-01 MED ORDER — EPINEPHRINE 0.3 MG/0.3ML IJ SOAJ
0.3000 mg | INTRAMUSCULAR | 0 refills | Status: AC | PRN
  Filled 2022-06-01: qty 2, 2d supply, fill #0

## 2022-06-01 MED ORDER — AMOXICILLIN-POT CLAVULANATE 875-125 MG PO TABS
1.0000 | ORAL_TABLET | Freq: Two times a day (BID) | ORAL | 0 refills | Status: DC
  Filled 2022-06-01: qty 20, 10d supply, fill #0

## 2022-06-01 NOTE — Assessment & Plan Note (Signed)
New. Rx sent for epipen to have on hand.

## 2022-06-01 NOTE — Assessment & Plan Note (Signed)
Rx provided for augmentin.

## 2022-06-01 NOTE — Progress Notes (Signed)
MyChart Video Visit    Virtual Visit via Video Note   This visit type was conducted due to national recommendations for restrictions regarding the COVID-19 Pandemic (e.g. social distancing) in an effort to limit this patient's exposure and mitigate transmission in our community. This patient is at least at moderate risk for complications without adequate follow up. This format is felt to be most appropriate for this patient at this time. Physical exam was limited by quality of the video and audio technology used for the visit. CMA was able to get the patient set up on a video visit.  Patient location: Home. Patient and provider in visit Provider location: Office  I discussed the limitations of evaluation and management by telemedicine and the availability of in person appointments. The patient expressed understanding and agreed to proceed.  Visit Date: 06/01/2022  Today's healthcare provider: Nance Pear, NP     Subjective:    Patient ID: Allison Logan, female    DOB: [birth year], 41 y.o.   MRN: [id number]  Chief Complaint  Patient presents with   Follow-up    Will like to get epi pen refill   Sinus Problem    "Having sinus infection symptoms"    HPI  She had a "reaction" to a pineapple drink a couple of weeks ago.  Took some benadryl which helped. Would like to have an epipen on hand just in case.   She started developing burning/ear pain, nasal congestion/facial pain- she is prone to sinus infections and she will be travelling outside of the country ([place]).  She would like to have an antibiotic on hand in case symptoms worsen while she is on vacation.  No past medical history on file.  Past Surgical History:  Procedure Laterality Date   COSMETIC SURGERY      No family history on file.  Social History   Socioeconomic History   Marital status: Single    Spouse name: Not on file   Number of children: Not on file   Years of education: Not on file    Highest education level: Not on file  Occupational History   Not on file  Tobacco Use   Smoking status: Former   Smokeless tobacco: Never  Substance and Sexual Activity   Alcohol use: Yes    Comment: 1-2 glasses wine/day   Drug use: No   Sexual activity: Yes    Birth control/protection: I.U.D.  Other Topics Concern   Not on file  Social History Narrative   Not on file   Social Determinants of Health   Financial Resource Strain: Not on file  Food Insecurity: Not on file  Transportation Needs: Not on file  Physical Activity: Not on file  Stress: Not on file  Social Connections: Not on file  Intimate Partner Violence: Not on file    Outpatient Medications Prior to Visit  Medication Sig Dispense Refill   fluconazole (DIFLUCAN) 150 MG tablet Take 1 tablet (150 mg total) by mouth daily. 1 tablet 0   meloxicam (MOBIC) 7.5 MG tablet Take 1 tablet (7.5 mg total) by mouth daily. 90 tablet 1   methocarbamol (ROBAXIN) 500 MG tablet Take 1 tablet (500 mg total) by mouth 3 (three) times daily. 90 tablet 1   mupirocin ointment (BACTROBAN) 2 % Apply to affected area 3 times a day for 7 days 22 g 3   PARAGARD INTRAUTERINE COPPER IUD IUD 1 each by Intrauterine route once.     valACYclovir (VALTREX) 1000  MG tablet Take 1 tablet by mouth every day 90 tablet 1   No facility-administered medications prior to visit.    Allergies  Allergen Reactions   Latex Swelling    Swelling and Rash Swelling and Rash    Influenza Vaccines    Pineapple    Tetracyclines & Related Swelling and Rash    ROS    See HPI Objective:    Physical Exam  There were no vitals taken for this visit. Wt Readings from Last 3 Encounters:  11/22/21 111 lb (50.3 kg)  03/03/20 112 lb (50.8 kg)  04/18/18 112 lb (50.8 kg)    Gen: Awake, alert, no acute distress Resp: Breathing is even and non-labored Psych: calm/pleasant demeanor Neuro: Alert and Oriented x 3, + facial symmetry, speech is clear.      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 Rx sent for epipen to have on hand.       Acute sinusitis    Rx provided for augmentin.       Relevant Medications   amoxicillin-clavulanate (AUGMENTIN) 875-125 MG tablet    I am having [place] A. Maldonado "Angel" start on EPINEPHrine and amoxicillin-clavulanate. I am also having her maintain her paragard intrauterine copper, meloxicam, methocarbamol, valACYclovir, fluconazole, and mupirocin ointment.  Meds ordered this encounter  Medications   EPINEPHrine (EPIPEN 2-PAK) 0.3 mg/0.3 mL IJ SOAJ injection    Sig: Inject 0.3 mg into the muscle as needed for anaphylaxis.    Dispense:  2 each    Refill:  0    Order Specific Question:   Supervising Provider    Answer:   Penni Homans A [[id number]]   amoxicillin-clavulanate (AUGMENTIN) 875-125 MG tablet    Sig: Take 1 tablet by mouth 2 (two) times daily.    Dispense:  20 tablet    Refill:  0    Order Specific Question:   Supervising Provider    Answer:   Penni Homans A [[id number]]    I discussed the assessment and treatment plan with the patient. The patient was provided an opportunity to ask questions and all were answered. The patient agreed with the plan and demonstrated an understanding of the instructions.   The patient was advised to call back or seek an in-person evaluation if the symptoms worsen or if the condition fails to improve as anticipated.    Nance Pear, NP [employer] [phone] (phone) [phone] (fax)  [place]

## 2022-10-25 ENCOUNTER — Other Ambulatory Visit (HOSPITAL_BASED_OUTPATIENT_CLINIC_OR_DEPARTMENT_OTHER): Payer: Self-pay

## 2022-10-25 MED ORDER — VALACYCLOVIR HCL 500 MG PO TABS
500.0000 mg | ORAL_TABLET | Freq: Every day | ORAL | 3 refills | Status: DC
  Filled 2022-10-25: qty 30, 30d supply, fill #0
  Filled 2023-08-28: qty 30, 30d supply, fill #1
  Filled 2023-09-23: qty 30, 30d supply, fill #2

## 2023-01-16 ENCOUNTER — Encounter: Payer: Self-pay | Admitting: *Deleted

## 2023-09-23 ENCOUNTER — Other Ambulatory Visit: Payer: Self-pay

## 2023-09-25 ENCOUNTER — Other Ambulatory Visit: Payer: Self-pay

## 2023-11-08 ENCOUNTER — Other Ambulatory Visit (HOSPITAL_BASED_OUTPATIENT_CLINIC_OR_DEPARTMENT_OTHER): Payer: Self-pay

## 2023-11-20 ENCOUNTER — Other Ambulatory Visit (HOSPITAL_BASED_OUTPATIENT_CLINIC_OR_DEPARTMENT_OTHER): Payer: Self-pay

## 2023-11-20 MED ORDER — VALACYCLOVIR HCL 500 MG PO TABS
500.0000 mg | ORAL_TABLET | Freq: Every day | ORAL | 0 refills | Status: DC
  Filled 2023-11-20: qty 30, 30d supply, fill #0
  Filled 2024-01-18: qty 30, 30d supply, fill #1
  Filled 2024-04-09: qty 30, 30d supply, fill #2

## 2023-12-11 ENCOUNTER — Encounter: Payer: Self-pay | Admitting: Family Medicine

## 2023-12-11 ENCOUNTER — Ambulatory Visit (INDEPENDENT_AMBULATORY_CARE_PROVIDER_SITE_OTHER): Admitting: Family Medicine

## 2023-12-11 VITALS — BP 116/82 | HR 58 | Resp 12 | Ht 59.0 in | Wt 107.8 lb

## 2023-12-11 DIAGNOSIS — H6992 Unspecified Eustachian tube disorder, left ear: Secondary | ICD-10-CM | POA: Diagnosis not present

## 2023-12-11 NOTE — Patient Instructions (Addendum)
 Claritin (loratadine), Allegra (fexofenadine), Zyrtec (cetirizine) which is also equivalent to Xyzal (levocetirizine); these are listed in order from weakest to strongest. Generic, and therefore cheaper, options are in the parentheses.   Go back on your Nasacort. 2 sprays each nostril, once daily. Aim towards the same side eye when you spray.  There are available OTC, and the generic versions, which may be cheaper, are in parentheses. Show this to a pharmacist if you have trouble finding any of these items.  Send me a message in 2 weeks if no better.   Let us know if you need anything.

## 2023-12-11 NOTE — Progress Notes (Signed)
 Chief Complaint  Patient presents with   Acute Visit    Patient presents today for ear problem.    Pt is here for left ear pain. Duration: 2 weeks Progression: worse Associated symptoms: Fullness and sensitivity to sound Denies: congestion, facial fullness, coryza, bleeding, or discharge from ear Treatment to date: Q tips  BP 116/82   Pulse (!) 58   Resp 12   Ht 4\' 11"  (1.499 m)   Wt 107 lb 12.8 oz (48.9 kg)   SpO2 99%   BMI 21.77 kg/m  General: Awake, alert, appearing stated age HEENT:  L ear- Canal patent without drainage or erythema, TM is neg R ear- canal patent without drainage or erythema, TM is neg Nose- nares patent and without discharge Mouth- Lips, gums and dentition unremarkable, pharynx is without erythema or exudate Neck: No adenopathy Lungs: Normal effort, no accessory muscle use Psych: Age appropriate judgment and insight, normal mood and affect  Dysfunction of left eustachian tube  Intranasal corticosteroid, 2 sprays daily for the next 2 weeks.  If no improvement, will refer to ENT.  She should probably schedule her physical. Pt voiced understanding and agreement to the plan.  Jilda Roche [place], DO 12/11/23 12:16 PM

## 2023-12-13 ENCOUNTER — Ambulatory Visit: Admitting: Family Medicine

## 2024-03-05 IMAGING — MR MR HEAD W/O CM
11 series · 48 of 48 positions shown · non-contrast
Comparison: None.

CLINICAL DATA: Headache, new or worsening, post exertion or sex
(Age 18-49y) Worsening headache, associated with sneezing/coughing
now

EXAM:
MRI HEAD WITHOUT CONTRAST
TECHNIQUE: Multiplanar, multiecho pulse sequences of the brain and surrounding
structures were obtained without intravenous contrast.

[Series 2: DWI · axial · 3.0mm · 1.25mm/px · z∈[-98,+61]mm · 9 of 109 slices shown (1 of 4)]
[im 1/109]
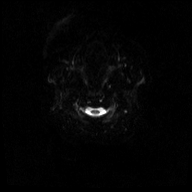
[im 14/109]
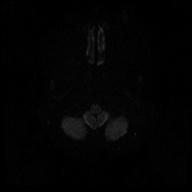
[im 28/109]
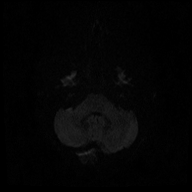
[im 41/109]
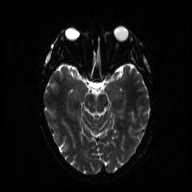
[im 55/109]
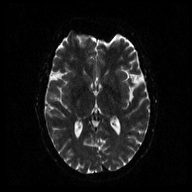
[im 68/109]
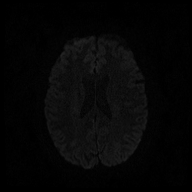
[im 82/109]
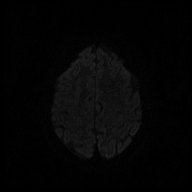
[im 95/109]
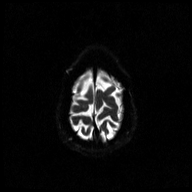
[im 109/109]
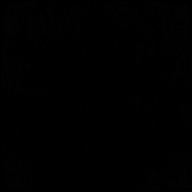

[Series 3: DWI · axial · 3.0mm · 1.25mm/px · z∈[-98,+61]mm · 4 of 55 slices shown (2 of 4)]
[im 1/55]
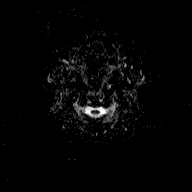
[im 19/55]
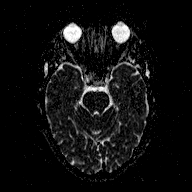
[im 37/55]
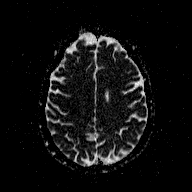
[im 55/55]
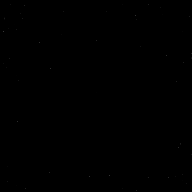

[Series 4: DWI · coronal · 5.0mm · 1.25mm/px · 5 of 67 slices shown (3 of 4)]
[im 1/67]
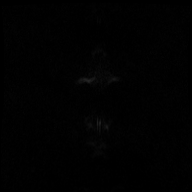
[im 17/67]
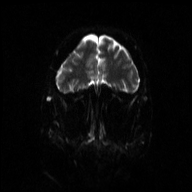
[im 34/67]
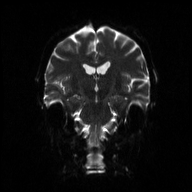
[im 50/67]
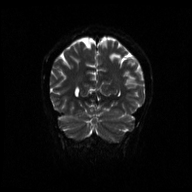
[im 67/67]
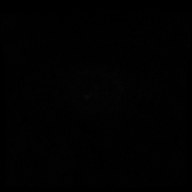

[Series 5: DWI · coronal · 5.0mm · 1.25mm/px · 3 of 34 slices shown (4 of 4)]
[im 1/34]
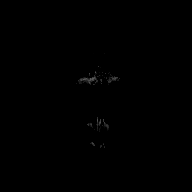
[im 17/34]
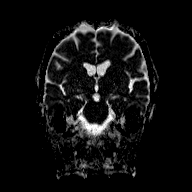
[im 34/34]
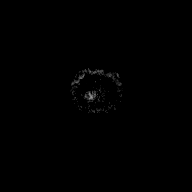

[Series 6: T1 · sagittal · 5.0mm · 0.45mm/px · 2 of 25 slices shown (1 of 3)]
[im 1/25]
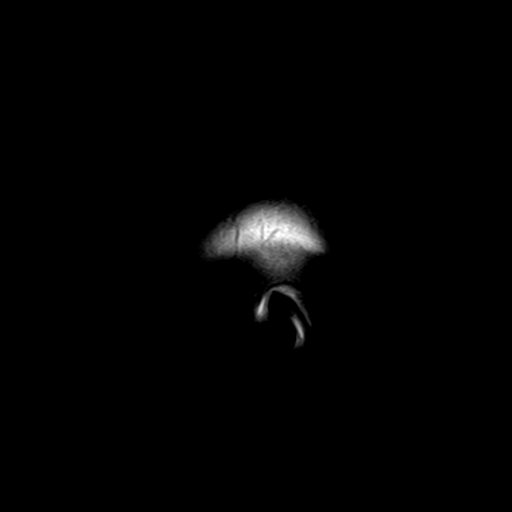
[im 25/25]
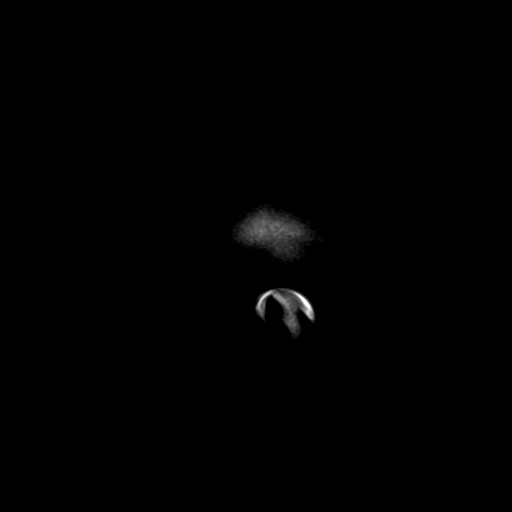

[Series 7: T2 · axial · 5.0mm · 0.72mm/px · z∈[-100,+65]mm · 2 of 25 slices shown (1 of 3)]
[im 1/25]
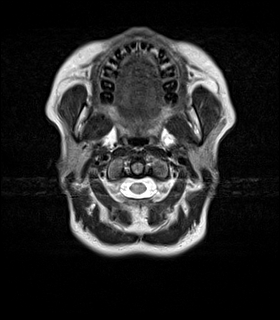
[im 25/25]
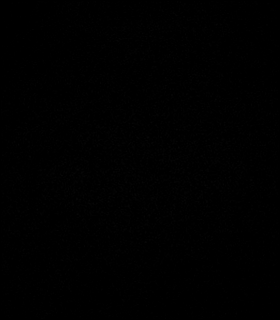

[Series 8: FLAIR · axial · 3.0mm · 0.45mm/px · z∈[-97,+62]mm · 4 of 55 slices shown]
[im 1/55]
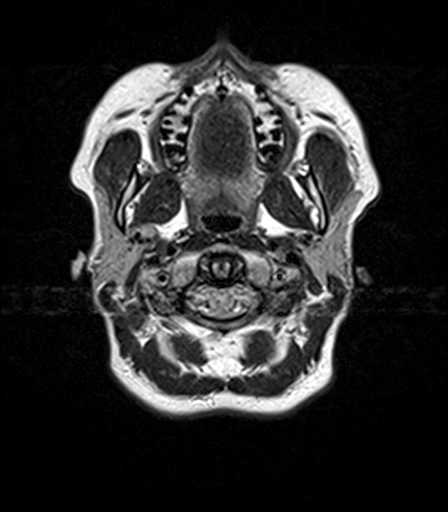
[im 19/55]
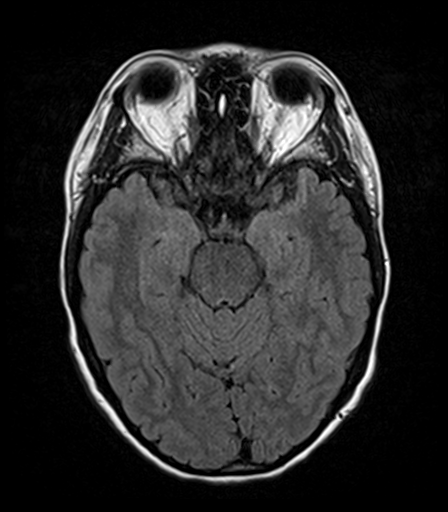
[im 37/55]
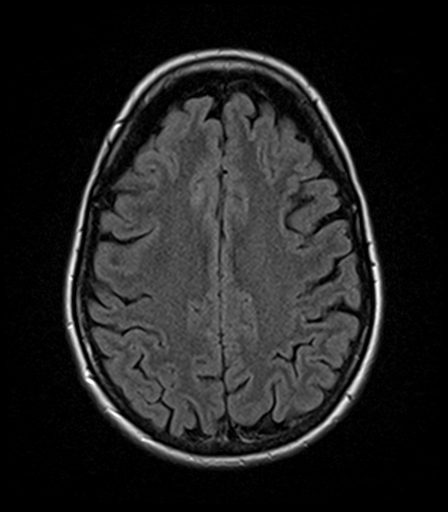
[im 55/55]
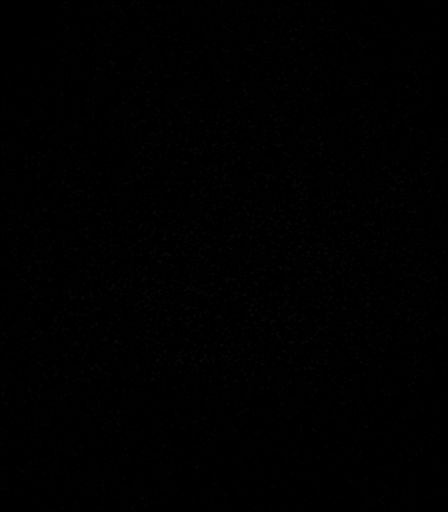

[Series 9: T2 · axial · 5.0mm · 0.72mm/px · z∈[-93,+58]mm · 2 of 23 slices shown (2 of 3)]
[im 1/23]
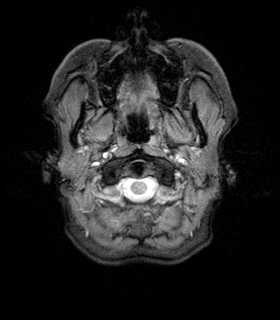
[im 23/23]
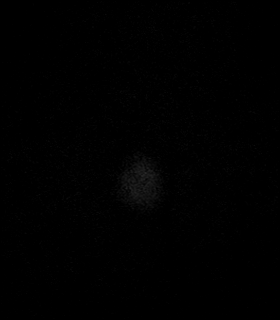

[Series 10: T1 · axial · 1.0mm · 0.94mm/px · z∈[-104,+68]mm · 13 of 175 slices shown (2 of 3)]
[im 1/175]
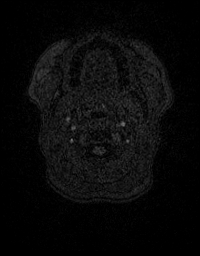
[im 15/175]
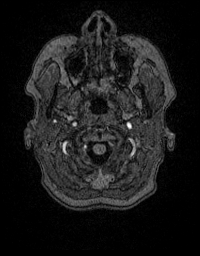
[im 30/175]
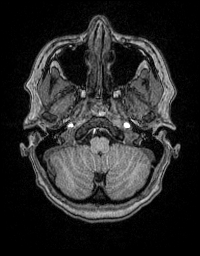
[im 44/175]
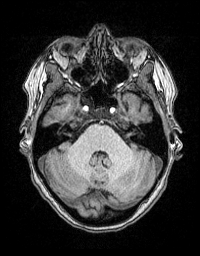
[im 59/175]
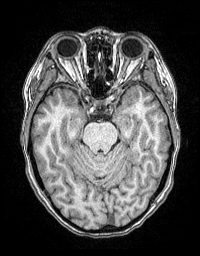
[im 73/175]
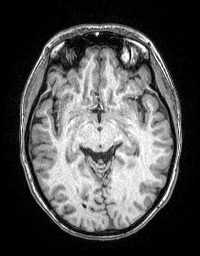
[im 88/175]
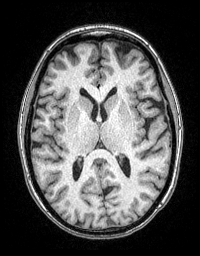
[im 102/175]
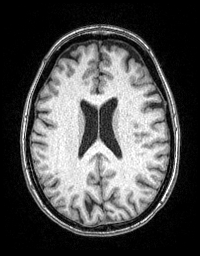
[im 117/175]
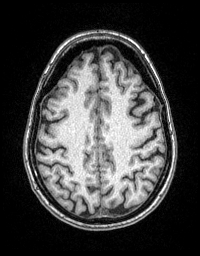
[im 131/175]
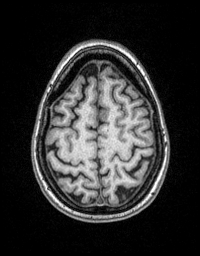
[im 146/175]
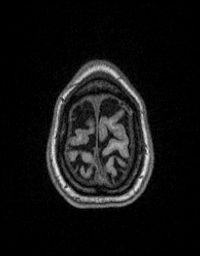
[im 160/175]
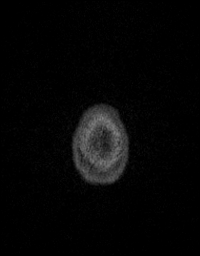
[im 175/175]
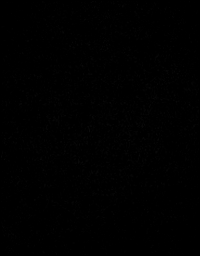

[Series 11: T2 · coronal · 5.0mm · 0.43mm/px · 2 of 31 slices shown (3 of 3)]
[im 1/31]
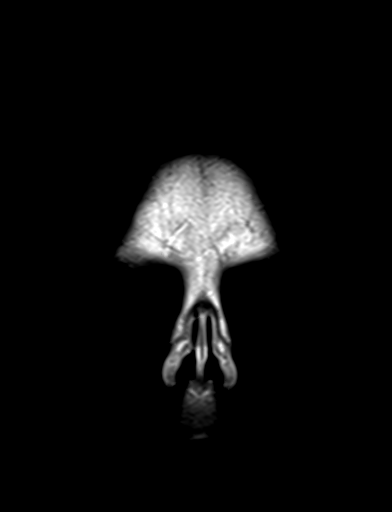
[im 31/31]
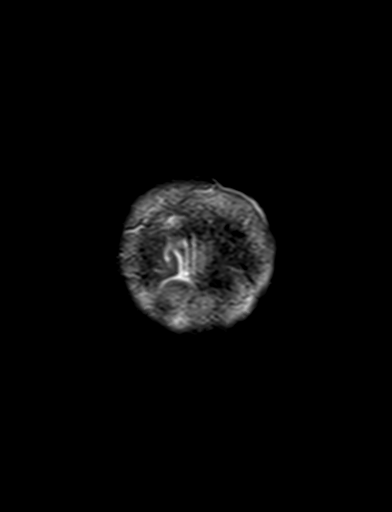

[Series 12: T1 · sagittal · 5.0mm · 0.45mm/px · 2 of 25 slices shown (3 of 3)]
[im 1/25]
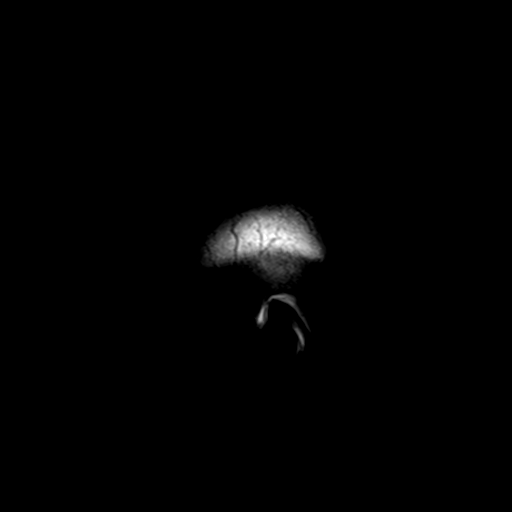
[im 25/25]
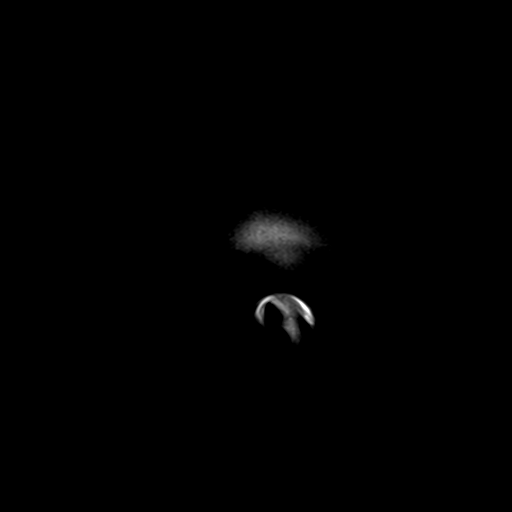

[48 of 48 positions shown; findings below may reference images not displayed]

FINDINGS: Brain: No acute infarction, hemorrhage, hydrocephalus, extra-axial
collection or mass lesion.

Vascular: Major arterial flow voids are maintained at the skull
base.

Skull and upper cervical spine: Normal marrow signal.

Sinuses/Orbits: Mild paranasal sinus mucosal thickening.
Unremarkable orbits.

Other: No mastoid effusions.
IMPRESSION: Normal brain MRI.  No evidence of acute abnormality.

## 2024-06-27 ENCOUNTER — Other Ambulatory Visit (HOSPITAL_BASED_OUTPATIENT_CLINIC_OR_DEPARTMENT_OTHER): Payer: Self-pay

## 2024-06-27 MED ORDER — VALACYCLOVIR HCL 500 MG PO TABS
500.0000 mg | ORAL_TABLET | Freq: Every day | ORAL | 2 refills | Status: AC
  Filled 2024-06-27: qty 30, 30d supply, fill #0
  Filled 2024-10-16: qty 30, 30d supply, fill #1

## 2024-07-25 ENCOUNTER — Other Ambulatory Visit (HOSPITAL_BASED_OUTPATIENT_CLINIC_OR_DEPARTMENT_OTHER): Payer: Self-pay

## 2024-07-25 ENCOUNTER — Encounter: Payer: Self-pay | Admitting: Family Medicine

## 2024-07-25 ENCOUNTER — Telehealth: Admitting: Family Medicine

## 2024-07-25 DIAGNOSIS — J0100 Acute maxillary sinusitis, unspecified: Secondary | ICD-10-CM | POA: Diagnosis not present

## 2024-07-25 MED ORDER — AMOXICILLIN-POT CLAVULANATE 875-125 MG PO TABS
1.0000 | ORAL_TABLET | Freq: Two times a day (BID) | ORAL | 0 refills | Status: AC
  Filled 2024-07-25: qty 14, 7d supply, fill #0

## 2024-07-25 MED ORDER — FLUCONAZOLE 150 MG PO TABS
ORAL_TABLET | ORAL | 0 refills | Status: AC
  Filled 2024-07-25: qty 2, 4d supply, fill #0

## 2024-07-25 NOTE — Progress Notes (Signed)
 Chief Complaint  Patient presents with   Sinusitis    Ear pain, congestion , runny nose onset: 1 week     [place] here for URI complaints. We are interacting via web portal for an electronic face-to-face visit. I verified patient's ID using 2 identifiers. Patient agreed to proceed with visit via this method. Patient is at home, I am at office. Patient and I are present for visit.   Duration: 7 days; worsening as 2 d ago Associated symptoms: sinus congestion, sinus pain, rhinorrhea, ear pain, subj fevers, and dental pain Denies: itchy watery eyes, ear drainage, sore throat, wheezing, shortness of breath, myalgia, and coughing Treatment to date: INCS, ibuprofen, Claritin Sick contacts: Yes  History reviewed. No pertinent past medical history.  Objective No conversational dyspnea Age appropriate judgment and insight Nml affect and mood  Acute maxillary sinusitis, recurrence not specified - Plan: amoxicillin -clavulanate (AUGMENTIN ) 875-125 MG tablet, fluconazole  (DIFLUCAN ) 150 MG tablet  7 d of Augmentin  given worsening. Diflucan  prn. Continue to push fluids, practice good hand hygiene, ibuprofen.  F/u prn. If starting to experience fevers, shaking, or shortness of breath, seek immediate care. Pt voiced understanding and agreement to the plan.  Mabel [place], DO 07/25/24 2:31 PM

## 2024-08-16 ENCOUNTER — Other Ambulatory Visit (HOSPITAL_BASED_OUTPATIENT_CLINIC_OR_DEPARTMENT_OTHER): Payer: Self-pay

## 2024-08-16 MED ORDER — DOXYCYCLINE HYCLATE 100 MG PO TABS
100.0000 mg | ORAL_TABLET | Freq: Two times a day (BID) | ORAL | 0 refills | Status: AC
  Filled 2024-08-16: qty 14, 7d supply, fill #0

## 2024-09-02 ENCOUNTER — Other Ambulatory Visit: Payer: Self-pay | Admitting: Family Medicine

## 2024-09-02 ENCOUNTER — Other Ambulatory Visit (HOSPITAL_BASED_OUTPATIENT_CLINIC_OR_DEPARTMENT_OTHER): Payer: Self-pay

## 2024-09-02 MED ORDER — METHOCARBAMOL 500 MG PO TABS
500.0000 mg | ORAL_TABLET | Freq: Three times a day (TID) | ORAL | 0 refills | Status: AC | PRN
  Filled 2024-09-02: qty 90, 30d supply, fill #0
